# Patient Record
Sex: Female | Born: 1950 | Race: White | Hispanic: No | Marital: Married | Smoking: Former smoker
Health system: Southern US, Community
[De-identification: ages and names within clinical notes are randomized; demographics above are authoritative.]

## PROBLEM LIST (undated history)

## (undated) DIAGNOSIS — E7800 Pure hypercholesterolemia, unspecified: Secondary | ICD-10-CM

## (undated) DIAGNOSIS — T8859XA Other complications of anesthesia, initial encounter: Secondary | ICD-10-CM

## (undated) DIAGNOSIS — R112 Nausea with vomiting, unspecified: Secondary | ICD-10-CM

## (undated) DIAGNOSIS — I10 Essential (primary) hypertension: Secondary | ICD-10-CM

## (undated) DIAGNOSIS — R159 Full incontinence of feces: Secondary | ICD-10-CM

## (undated) DIAGNOSIS — M1990 Unspecified osteoarthritis, unspecified site: Secondary | ICD-10-CM

## (undated) DIAGNOSIS — G4733 Obstructive sleep apnea (adult) (pediatric): Secondary | ICD-10-CM

## (undated) DIAGNOSIS — M810 Age-related osteoporosis without current pathological fracture: Secondary | ICD-10-CM

## (undated) DIAGNOSIS — C801 Malignant (primary) neoplasm, unspecified: Secondary | ICD-10-CM

## (undated) DIAGNOSIS — N8110 Cystocele, unspecified: Secondary | ICD-10-CM

## (undated) HISTORY — DX: Cystocele, unspecified: N81.10

## (undated) HISTORY — DX: Essential (primary) hypertension: I10

## (undated) HISTORY — DX: Obstructive sleep apnea (adult) (pediatric): G47.33

## (undated) HISTORY — DX: Pure hypercholesterolemia, unspecified: E78.00

## (undated) HISTORY — DX: Full incontinence of feces: R15.9

## (undated) HISTORY — PX: TUBAL LIGATION: SHX77

## (undated) HISTORY — PX: INCONTINENCE SURGERY: SHX676

## (undated) HISTORY — PX: TONSILLECTOMY AND ADENOIDECTOMY: SUR1326

## (undated) HISTORY — DX: Age-related osteoporosis without current pathological fracture: M81.0

---

## 2000-03-27 ENCOUNTER — Encounter: Payer: Self-pay | Admitting: Obstetrics and Gynecology

## 2000-03-27 ENCOUNTER — Encounter: Admission: RE | Admit: 2000-03-27 | Discharge: 2000-03-27 | Payer: Self-pay | Admitting: Obstetrics and Gynecology

## 2001-04-06 ENCOUNTER — Encounter: Payer: Self-pay | Admitting: Obstetrics and Gynecology

## 2001-04-06 ENCOUNTER — Encounter: Admission: RE | Admit: 2001-04-06 | Discharge: 2001-04-06 | Payer: Self-pay | Admitting: Obstetrics and Gynecology

## 2002-04-07 ENCOUNTER — Encounter: Payer: Self-pay | Admitting: Obstetrics and Gynecology

## 2002-04-07 ENCOUNTER — Encounter: Admission: RE | Admit: 2002-04-07 | Discharge: 2002-04-07 | Payer: Self-pay | Admitting: Obstetrics and Gynecology

## 2002-04-08 ENCOUNTER — Encounter: Admission: RE | Admit: 2002-04-08 | Discharge: 2002-04-08 | Payer: Self-pay | Admitting: Obstetrics and Gynecology

## 2002-04-08 ENCOUNTER — Encounter: Payer: Self-pay | Admitting: Obstetrics and Gynecology

## 2003-04-11 ENCOUNTER — Encounter: Admission: RE | Admit: 2003-04-11 | Discharge: 2003-04-11 | Payer: Self-pay | Admitting: Obstetrics and Gynecology

## 2003-04-11 ENCOUNTER — Encounter: Payer: Self-pay | Admitting: Obstetrics and Gynecology

## 2004-04-12 ENCOUNTER — Encounter: Admission: RE | Admit: 2004-04-12 | Discharge: 2004-04-12 | Payer: Self-pay | Admitting: Obstetrics and Gynecology

## 2004-10-17 HISTORY — PX: COLONOSCOPY: SHX174

## 2005-04-24 ENCOUNTER — Encounter: Admission: RE | Admit: 2005-04-24 | Discharge: 2005-04-24 | Payer: Self-pay | Admitting: Obstetrics and Gynecology

## 2006-04-28 ENCOUNTER — Encounter: Admission: RE | Admit: 2006-04-28 | Discharge: 2006-04-28 | Payer: Self-pay | Admitting: Obstetrics and Gynecology

## 2007-04-30 ENCOUNTER — Encounter: Admission: RE | Admit: 2007-04-30 | Discharge: 2007-04-30 | Payer: Self-pay | Admitting: Obstetrics and Gynecology

## 2008-05-02 ENCOUNTER — Encounter: Admission: RE | Admit: 2008-05-02 | Discharge: 2008-05-02 | Payer: Self-pay | Admitting: Obstetrics and Gynecology

## 2008-11-16 ENCOUNTER — Encounter: Admission: RE | Admit: 2008-11-16 | Discharge: 2008-11-16 | Payer: Self-pay | Admitting: Family Medicine

## 2009-05-03 ENCOUNTER — Encounter: Admission: RE | Admit: 2009-05-03 | Discharge: 2009-05-03 | Payer: Self-pay | Admitting: Family Medicine

## 2010-05-14 ENCOUNTER — Encounter: Admission: RE | Admit: 2010-05-14 | Discharge: 2010-05-14 | Payer: Self-pay | Admitting: Obstetrics and Gynecology

## 2011-04-10 ENCOUNTER — Other Ambulatory Visit: Payer: Self-pay | Admitting: Family Medicine

## 2011-04-10 DIAGNOSIS — Z1231 Encounter for screening mammogram for malignant neoplasm of breast: Secondary | ICD-10-CM

## 2011-05-27 ENCOUNTER — Ambulatory Visit
Admission: RE | Admit: 2011-05-27 | Discharge: 2011-05-27 | Disposition: A | Discharge status: Home / Self Care | Payer: BC Managed Care – PPO | Source: Ambulatory Visit | Attending: Family Medicine | Admitting: Family Medicine

## 2011-05-27 DIAGNOSIS — Z1231 Encounter for screening mammogram for malignant neoplasm of breast: Secondary | ICD-10-CM

## 2012-04-22 ENCOUNTER — Other Ambulatory Visit: Payer: Self-pay | Admitting: Family Medicine

## 2012-04-22 DIAGNOSIS — Z1231 Encounter for screening mammogram for malignant neoplasm of breast: Secondary | ICD-10-CM

## 2012-06-08 ENCOUNTER — Ambulatory Visit: Payer: BC Managed Care – PPO

## 2012-06-23 ENCOUNTER — Ambulatory Visit
Admission: RE | Admit: 2012-06-23 | Discharge: 2012-06-23 | Disposition: A | Discharge status: Home / Self Care | Payer: BC Managed Care – PPO | Source: Ambulatory Visit | Attending: Family Medicine | Admitting: Family Medicine

## 2012-06-23 DIAGNOSIS — Z1231 Encounter for screening mammogram for malignant neoplasm of breast: Secondary | ICD-10-CM

## 2012-12-10 ENCOUNTER — Other Ambulatory Visit: Payer: Self-pay | Admitting: Family Medicine

## 2012-12-10 ENCOUNTER — Other Ambulatory Visit (HOSPITAL_COMMUNITY)
Admission: RE | Admit: 2012-12-10 | Discharge: 2012-12-10 | Disposition: A | Discharge status: Home / Self Care | Payer: BC Managed Care – PPO | Source: Ambulatory Visit | Attending: Family Medicine | Admitting: Family Medicine

## 2012-12-10 DIAGNOSIS — Z124 Encounter for screening for malignant neoplasm of cervix: Secondary | ICD-10-CM | POA: Insufficient documentation

## 2013-03-16 DEATH — deceased

## 2013-06-01 ENCOUNTER — Other Ambulatory Visit: Payer: Self-pay

## 2013-06-01 DIAGNOSIS — Z1231 Encounter for screening mammogram for malignant neoplasm of breast: Secondary | ICD-10-CM

## 2013-06-24 ENCOUNTER — Ambulatory Visit
Admission: RE | Admit: 2013-06-24 | Discharge: 2013-06-24 | Disposition: A | Discharge status: Home / Self Care | Payer: BC Managed Care – PPO | Source: Ambulatory Visit

## 2013-06-24 DIAGNOSIS — Z1231 Encounter for screening mammogram for malignant neoplasm of breast: Secondary | ICD-10-CM

## 2013-08-27 ENCOUNTER — Encounter: Payer: Self-pay | Admitting: General Surgery

## 2013-08-27 DIAGNOSIS — E7800 Pure hypercholesterolemia, unspecified: Secondary | ICD-10-CM | POA: Insufficient documentation

## 2013-08-27 DIAGNOSIS — I10 Essential (primary) hypertension: Secondary | ICD-10-CM | POA: Insufficient documentation

## 2013-08-27 DIAGNOSIS — G4733 Obstructive sleep apnea (adult) (pediatric): Secondary | ICD-10-CM

## 2013-08-30 ENCOUNTER — Encounter: Payer: Self-pay | Admitting: Cardiology

## 2013-08-30 ENCOUNTER — Ambulatory Visit (INDEPENDENT_AMBULATORY_CARE_PROVIDER_SITE_OTHER): Payer: BC Managed Care – PPO | Admitting: Cardiology

## 2013-08-30 VITALS — BP 153/91 | HR 73 | Ht 65.5 in | Wt 185.4 lb

## 2013-08-30 DIAGNOSIS — I10 Essential (primary) hypertension: Secondary | ICD-10-CM

## 2013-08-30 DIAGNOSIS — G4733 Obstructive sleep apnea (adult) (pediatric): Secondary | ICD-10-CM

## 2013-08-30 NOTE — Progress Notes (Signed)
[address] [place], [place]  [address] Phone: [phone] Fax:  [phone]  Date:  08/30/2013   ID:  ALLANNAH KEMPEN, DOB April 11, 1951, MRN [id number]  PCP:  Lupita Raider, MD  Sleep Medicine:  Armanda Magic, MD   History of Present Illness: Beth Paul is a 62 y.o. female with a history of OSA and HTN who presents today for followup.  She is doing well.  She tolerates her CPAP without difficulty.  She tolerates her nasal pillow mask with chin strap and feels with pressure is adequate.  She has no daytime sleepiness and feels rested when she gets up in the am.      Wt Readings from Last 3 Encounters:  08/30/13 185 lb 6.4 oz (84.097 kg)  08/27/13 180 lb 9.6 oz (81.92 kg)    Past Medical History  Diagnosis Date  . Hypertension   . Hypercholesteremia   . Osteoporosis   . OSA (obstructive sleep apnea)     w CPAP at 6cm H2O  . Female bladder prolapse     Dr. Christella Hartigan  . Fecal incontinence     Dr Mia Creek    Current Outpatient Prescriptions  Medication Sig Dispense Refill  . BABY ASPIRIN PO Take 81 mg by mouth daily.      . Calcium Carb-Cholecalciferol (CALCIUM + D3) 600-200 MG-UNIT TABS Take 1 tablet by mouth 2 (two) times daily.      . Multiple Vitamin (MULTIVITAMIN) tablet Take 1 tablet by mouth daily.      Marland Kitchen NIFEdipine (PROCARDIA-XL/ADALAT CC) 30 MG 24 hr tablet Take 30 mg by mouth daily.      . Omega-3 Fatty Acids (FISH OIL) 1000 MG CAPS Take 1 capsule by mouth 3 (three) times daily.      . simvastatin (ZOCOR) 20 MG tablet Take 20 mg by mouth daily.       No current facility-administered medications for this visit.    Allergies:    Allergies  Allergen Reactions  . Lipitor [Atorvastatin] Other (See Comments)    Muscle aches    Social History:  The patient  reports that she quit smoking about 23 years ago. Her smoking use included Cigarettes. She smoked 0.00 packs per day. She does not have any smokeless tobacco history on file. She reports that she  does not drink alcohol or use illicit drugs.   Family History:  The patient's family history is not on file.   ROS:  Please see the history of present illness.      All other systems reviewed and negative.   PHYSICAL EXAM: VS:  BP 153/91  Pulse 73  Ht 5' 5.5" (1.664 m)  Wt 185 lb 6.4 oz (84.097 kg)  BMI 30.37 kg/m2 Well nourished, well developed, in no acute distress HEENT: normal Neck: no JVD Cardiac:  normal S1, S2; RRR; no murmur Lungs:  clear to auscultation bilaterally, no wheezing, rhonchi or rales Abd: soft, nontender, no hepatomegaly Ext: no edema Skin: warm and dry Neuro:  CNs 2-12 intact, no focal abnormalities noted       ASSESSMENT AND PLAN:  1. OSA on CPAP and tolerating well  - Her SD card is not working today so I have asked her to go to [facility] and get an new card and in a few days bring it back to our office to download 2. HTN - mildly elevated  - continue Procardia  Followup with me in 6 months  Signed, Armanda Magic, MD 08/30/2013 9:19  AM  

## 2013-08-30 NOTE — Patient Instructions (Signed)
Your physician recommends that you continue on your current medications as directed. Please refer to the Current Medication list given to you today.  Your physician wants you to follow-up in: 6 Months with Dr Turner You will receive a reminder letter in the mail two months in advance. If you don't receive a letter, please call our office to schedule the follow-up appointment.  

## 2013-09-23 ENCOUNTER — Other Ambulatory Visit: Payer: Self-pay | Admitting: General Surgery

## 2013-09-23 DIAGNOSIS — G4733 Obstructive sleep apnea (adult) (pediatric): Secondary | ICD-10-CM

## 2013-12-24 ENCOUNTER — Telehealth: Payer: Self-pay | Admitting: Cardiology

## 2013-12-24 NOTE — Telephone Encounter (Signed)
Faxed for pt.

## 2013-12-24 NOTE — Telephone Encounter (Signed)
Ok that is fine. Ok to fax paper that I signed to dentist

## 2013-12-24 NOTE — Telephone Encounter (Signed)
New Prob     Following up on paperwork regarding sleep apnea that was faxed over on first week of April. Please call.

## 2013-12-24 NOTE — Telephone Encounter (Signed)
Pt stated in the past she wanted a oral device due to camping. 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 She just wants another option while out camping. She will still be using CPAP at home. TO Dr Mayford Knifeurner to advise

## 2013-12-24 NOTE — Telephone Encounter (Signed)
[facility]. Asked for a call back.

## 2014-04-14 ENCOUNTER — Other Ambulatory Visit: Payer: Self-pay | Admitting: Family Medicine

## 2014-04-14 DIAGNOSIS — N939 Abnormal uterine and vaginal bleeding, unspecified: Secondary | ICD-10-CM

## 2014-04-15 ENCOUNTER — Encounter (INDEPENDENT_AMBULATORY_CARE_PROVIDER_SITE_OTHER): Payer: Self-pay

## 2014-04-15 ENCOUNTER — Ambulatory Visit
Admission: RE | Admit: 2014-04-15 | Discharge: 2014-04-15 | Disposition: A | Discharge status: Home / Self Care | Payer: BC Managed Care – PPO | Source: Ambulatory Visit | Attending: Family Medicine | Admitting: Family Medicine

## 2014-04-15 DIAGNOSIS — N939 Abnormal uterine and vaginal bleeding, unspecified: Secondary | ICD-10-CM

## 2014-06-01 ENCOUNTER — Other Ambulatory Visit: Payer: Self-pay

## 2014-06-01 DIAGNOSIS — Z1231 Encounter for screening mammogram for malignant neoplasm of breast: Secondary | ICD-10-CM

## 2014-06-28 ENCOUNTER — Ambulatory Visit
Admission: RE | Admit: 2014-06-28 | Discharge: 2014-06-28 | Disposition: A | Discharge status: Home / Self Care | Payer: BC Managed Care – PPO | Source: Ambulatory Visit

## 2014-06-28 DIAGNOSIS — Z1231 Encounter for screening mammogram for malignant neoplasm of breast: Secondary | ICD-10-CM

## 2014-12-14 ENCOUNTER — Encounter: Payer: Self-pay | Admitting: Cardiology

## 2015-05-26 ENCOUNTER — Other Ambulatory Visit: Payer: Self-pay

## 2015-05-26 DIAGNOSIS — Z1231 Encounter for screening mammogram for malignant neoplasm of breast: Secondary | ICD-10-CM

## 2015-07-04 ENCOUNTER — Ambulatory Visit
Admission: RE | Admit: 2015-07-04 | Discharge: 2015-07-04 | Disposition: A | Discharge status: Home / Self Care | Payer: BC Managed Care – PPO | Source: Ambulatory Visit

## 2015-07-04 DIAGNOSIS — Z1231 Encounter for screening mammogram for malignant neoplasm of breast: Secondary | ICD-10-CM

## 2016-02-08 ENCOUNTER — Other Ambulatory Visit: Payer: Self-pay | Admitting: Family Medicine

## 2016-02-08 DIAGNOSIS — N6320 Unspecified lump in the left breast, unspecified quadrant: Secondary | ICD-10-CM

## 2016-02-14 ENCOUNTER — Ambulatory Visit
Admission: RE | Admit: 2016-02-14 | Discharge: 2016-02-14 | Disposition: A | Discharge status: Home / Self Care | Payer: BC Managed Care – PPO | Source: Ambulatory Visit | Attending: Family Medicine | Admitting: Family Medicine

## 2016-02-14 DIAGNOSIS — N6320 Unspecified lump in the left breast, unspecified quadrant: Secondary | ICD-10-CM

## 2016-06-18 ENCOUNTER — Other Ambulatory Visit: Payer: Self-pay | Admitting: Family Medicine

## 2016-06-18 DIAGNOSIS — Z1231 Encounter for screening mammogram for malignant neoplasm of breast: Secondary | ICD-10-CM

## 2016-07-17 ENCOUNTER — Ambulatory Visit
Admission: RE | Admit: 2016-07-17 | Discharge: 2016-07-17 | Disposition: A | Discharge status: Home / Self Care | Payer: Medicare Other | Source: Ambulatory Visit | Attending: Family Medicine | Admitting: Family Medicine

## 2016-07-17 DIAGNOSIS — Z1231 Encounter for screening mammogram for malignant neoplasm of breast: Secondary | ICD-10-CM

## 2017-06-10 ENCOUNTER — Other Ambulatory Visit: Payer: Self-pay | Admitting: Family Medicine

## 2017-06-10 DIAGNOSIS — Z1239 Encounter for other screening for malignant neoplasm of breast: Secondary | ICD-10-CM

## 2017-07-18 ENCOUNTER — Ambulatory Visit
Admission: RE | Admit: 2017-07-18 | Discharge: 2017-07-18 | Disposition: A | Discharge status: Home / Self Care | Payer: Medicare Other | Source: Ambulatory Visit | Attending: Family Medicine | Admitting: Family Medicine

## 2017-07-18 DIAGNOSIS — Z1239 Encounter for other screening for malignant neoplasm of breast: Secondary | ICD-10-CM

## 2018-06-17 ENCOUNTER — Other Ambulatory Visit: Payer: Self-pay | Admitting: Family Medicine

## 2018-06-17 DIAGNOSIS — Z1231 Encounter for screening mammogram for malignant neoplasm of breast: Secondary | ICD-10-CM

## 2018-07-22 ENCOUNTER — Ambulatory Visit
Admission: RE | Admit: 2018-07-22 | Discharge: 2018-07-22 | Disposition: A | Discharge status: Home / Self Care | Payer: Medicare Other | Source: Ambulatory Visit | Attending: Family Medicine | Admitting: Family Medicine

## 2018-07-22 DIAGNOSIS — Z1231 Encounter for screening mammogram for malignant neoplasm of breast: Secondary | ICD-10-CM

## 2018-07-23 ENCOUNTER — Other Ambulatory Visit: Payer: Self-pay | Admitting: Family Medicine

## 2018-07-23 DIAGNOSIS — R928 Other abnormal and inconclusive findings on diagnostic imaging of breast: Secondary | ICD-10-CM

## 2018-07-29 ENCOUNTER — Ambulatory Visit: Payer: Medicare Other

## 2018-07-29 ENCOUNTER — Ambulatory Visit
Admission: RE | Admit: 2018-07-29 | Discharge: 2018-07-29 | Disposition: A | Discharge status: Home / Self Care | Payer: Medicare Other | Source: Ambulatory Visit | Attending: Family Medicine | Admitting: Family Medicine

## 2018-07-29 DIAGNOSIS — R928 Other abnormal and inconclusive findings on diagnostic imaging of breast: Secondary | ICD-10-CM

## 2019-06-23 ENCOUNTER — Other Ambulatory Visit: Payer: Self-pay | Admitting: Family Medicine

## 2019-06-23 DIAGNOSIS — Z1231 Encounter for screening mammogram for malignant neoplasm of breast: Secondary | ICD-10-CM

## 2019-08-04 ENCOUNTER — Ambulatory Visit
Admission: RE | Admit: 2019-08-04 | Discharge: 2019-08-04 | Disposition: A | Discharge status: Home / Self Care | Payer: Medicare Other | Source: Ambulatory Visit | Attending: Family Medicine | Admitting: Family Medicine

## 2019-08-04 ENCOUNTER — Other Ambulatory Visit: Payer: Self-pay

## 2019-08-04 DIAGNOSIS — Z1231 Encounter for screening mammogram for malignant neoplasm of breast: Secondary | ICD-10-CM

## 2019-10-25 ENCOUNTER — Ambulatory Visit: Payer: Medicare Other

## 2020-05-11 DIAGNOSIS — M810 Age-related osteoporosis without current pathological fracture: Secondary | ICD-10-CM | POA: Diagnosis not present

## 2020-05-11 DIAGNOSIS — E782 Mixed hyperlipidemia: Secondary | ICD-10-CM | POA: Diagnosis not present

## 2020-05-11 DIAGNOSIS — G4733 Obstructive sleep apnea (adult) (pediatric): Secondary | ICD-10-CM | POA: Diagnosis not present

## 2020-05-11 DIAGNOSIS — G4700 Insomnia, unspecified: Secondary | ICD-10-CM | POA: Diagnosis not present

## 2020-05-11 DIAGNOSIS — Z0000 Encounter for general adult medical examination without abnormal findings: Secondary | ICD-10-CM | POA: Diagnosis not present

## 2020-05-11 DIAGNOSIS — I10 Essential (primary) hypertension: Secondary | ICD-10-CM | POA: Diagnosis not present

## 2020-05-11 DIAGNOSIS — N951 Menopausal and female climacteric states: Secondary | ICD-10-CM | POA: Diagnosis not present

## 2020-06-26 ENCOUNTER — Other Ambulatory Visit: Payer: Self-pay | Admitting: Family Medicine

## 2020-06-26 DIAGNOSIS — Z1231 Encounter for screening mammogram for malignant neoplasm of breast: Secondary | ICD-10-CM

## 2020-08-07 ENCOUNTER — Ambulatory Visit
Admission: RE | Admit: 2020-08-07 | Discharge: 2020-08-07 | Disposition: A | Discharge status: Home / Self Care | Payer: Medicare PPO | Source: Ambulatory Visit | Attending: Family Medicine | Admitting: Family Medicine

## 2020-08-07 ENCOUNTER — Other Ambulatory Visit: Payer: Self-pay

## 2020-08-07 DIAGNOSIS — Z1231 Encounter for screening mammogram for malignant neoplasm of breast: Secondary | ICD-10-CM

## 2020-08-28 DIAGNOSIS — G4733 Obstructive sleep apnea (adult) (pediatric): Secondary | ICD-10-CM | POA: Diagnosis not present

## 2020-08-30 ENCOUNTER — Other Ambulatory Visit: Payer: Self-pay

## 2020-08-30 ENCOUNTER — Ambulatory Visit
Admission: RE | Admit: 2020-08-30 | Discharge: 2020-08-30 | Disposition: A | Discharge status: Home / Self Care | Payer: Medicare PPO | Source: Ambulatory Visit | Attending: Family Medicine | Admitting: Family Medicine

## 2020-08-30 DIAGNOSIS — Z1231 Encounter for screening mammogram for malignant neoplasm of breast: Secondary | ICD-10-CM | POA: Diagnosis not present

## 2020-09-04 DIAGNOSIS — G4733 Obstructive sleep apnea (adult) (pediatric): Secondary | ICD-10-CM | POA: Diagnosis not present

## 2020-10-11 DIAGNOSIS — Z961 Presence of intraocular lens: Secondary | ICD-10-CM | POA: Diagnosis not present

## 2020-10-11 DIAGNOSIS — H5213 Myopia, bilateral: Secondary | ICD-10-CM | POA: Diagnosis not present

## 2020-11-17 DIAGNOSIS — I10 Essential (primary) hypertension: Secondary | ICD-10-CM | POA: Diagnosis not present

## 2020-11-17 DIAGNOSIS — E669 Obesity, unspecified: Secondary | ICD-10-CM | POA: Diagnosis not present

## 2020-11-17 DIAGNOSIS — E782 Mixed hyperlipidemia: Secondary | ICD-10-CM | POA: Diagnosis not present

## 2020-12-06 DIAGNOSIS — D2271 Melanocytic nevi of right lower limb, including hip: Secondary | ICD-10-CM | POA: Diagnosis not present

## 2020-12-06 DIAGNOSIS — L718 Other rosacea: Secondary | ICD-10-CM | POA: Diagnosis not present

## 2020-12-06 DIAGNOSIS — D2261 Melanocytic nevi of right upper limb, including shoulder: Secondary | ICD-10-CM | POA: Diagnosis not present

## 2020-12-06 DIAGNOSIS — D225 Melanocytic nevi of trunk: Secondary | ICD-10-CM | POA: Diagnosis not present

## 2020-12-06 DIAGNOSIS — Z85828 Personal history of other malignant neoplasm of skin: Secondary | ICD-10-CM | POA: Diagnosis not present

## 2020-12-06 DIAGNOSIS — L738 Other specified follicular disorders: Secondary | ICD-10-CM | POA: Diagnosis not present

## 2020-12-06 DIAGNOSIS — D2272 Melanocytic nevi of left lower limb, including hip: Secondary | ICD-10-CM | POA: Diagnosis not present

## 2020-12-06 DIAGNOSIS — D2262 Melanocytic nevi of left upper limb, including shoulder: Secondary | ICD-10-CM | POA: Diagnosis not present

## 2021-05-03 DIAGNOSIS — L738 Other specified follicular disorders: Secondary | ICD-10-CM | POA: Diagnosis not present

## 2021-05-03 DIAGNOSIS — L570 Actinic keratosis: Secondary | ICD-10-CM | POA: Diagnosis not present

## 2021-05-03 DIAGNOSIS — L649 Androgenic alopecia, unspecified: Secondary | ICD-10-CM | POA: Diagnosis not present

## 2021-05-03 DIAGNOSIS — L720 Epidermal cyst: Secondary | ICD-10-CM | POA: Diagnosis not present

## 2021-05-03 DIAGNOSIS — L723 Sebaceous cyst: Secondary | ICD-10-CM | POA: Diagnosis not present

## 2021-05-03 DIAGNOSIS — Z85828 Personal history of other malignant neoplasm of skin: Secondary | ICD-10-CM | POA: Diagnosis not present

## 2021-05-16 DIAGNOSIS — G4733 Obstructive sleep apnea (adult) (pediatric): Secondary | ICD-10-CM | POA: Diagnosis not present

## 2021-05-16 DIAGNOSIS — I10 Essential (primary) hypertension: Secondary | ICD-10-CM | POA: Diagnosis not present

## 2021-05-16 DIAGNOSIS — M810 Age-related osteoporosis without current pathological fracture: Secondary | ICD-10-CM | POA: Diagnosis not present

## 2021-05-16 DIAGNOSIS — E782 Mixed hyperlipidemia: Secondary | ICD-10-CM | POA: Diagnosis not present

## 2021-05-16 DIAGNOSIS — Z0000 Encounter for general adult medical examination without abnormal findings: Secondary | ICD-10-CM | POA: Diagnosis not present

## 2021-05-16 DIAGNOSIS — R7303 Prediabetes: Secondary | ICD-10-CM | POA: Diagnosis not present

## 2021-05-16 DIAGNOSIS — E669 Obesity, unspecified: Secondary | ICD-10-CM | POA: Diagnosis not present

## 2021-05-16 DIAGNOSIS — N951 Menopausal and female climacteric states: Secondary | ICD-10-CM | POA: Diagnosis not present

## 2021-05-16 DIAGNOSIS — G4700 Insomnia, unspecified: Secondary | ICD-10-CM | POA: Diagnosis not present

## 2021-05-16 IMAGING — MG DIGITAL SCREENING BILAT W/ TOMO W/ CAD
8 series · 8 of 24 positions shown · non-contrast
Comparison: Previous exam(s).

CLINICAL DATA: Screening.

EXAM:
DIGITAL SCREENING BILATERAL MAMMOGRAM WITH TOMO AND CAD

[R CC synth-2D]
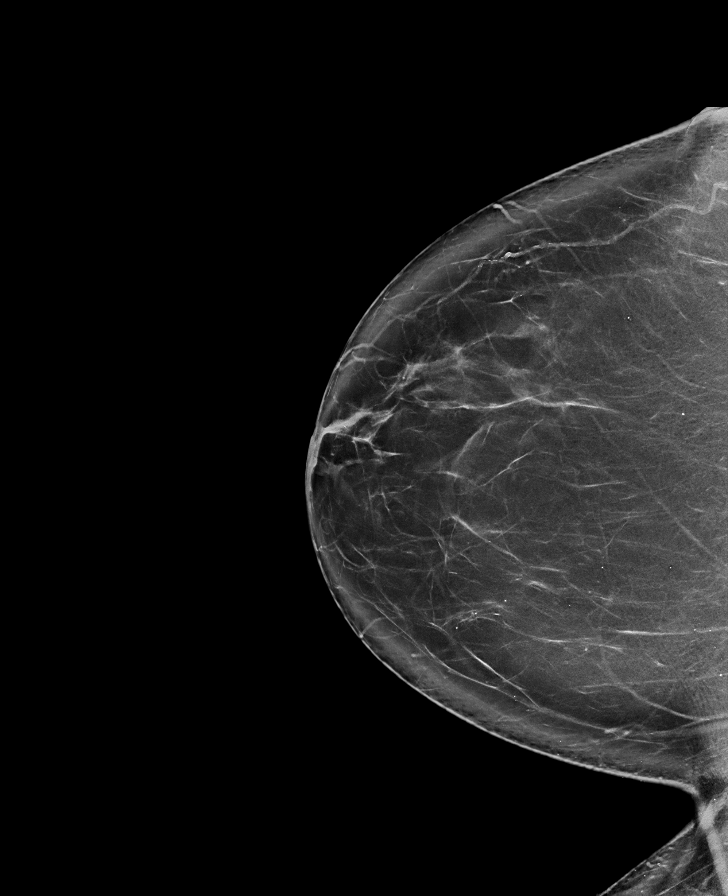

[L MLO synth-2D]
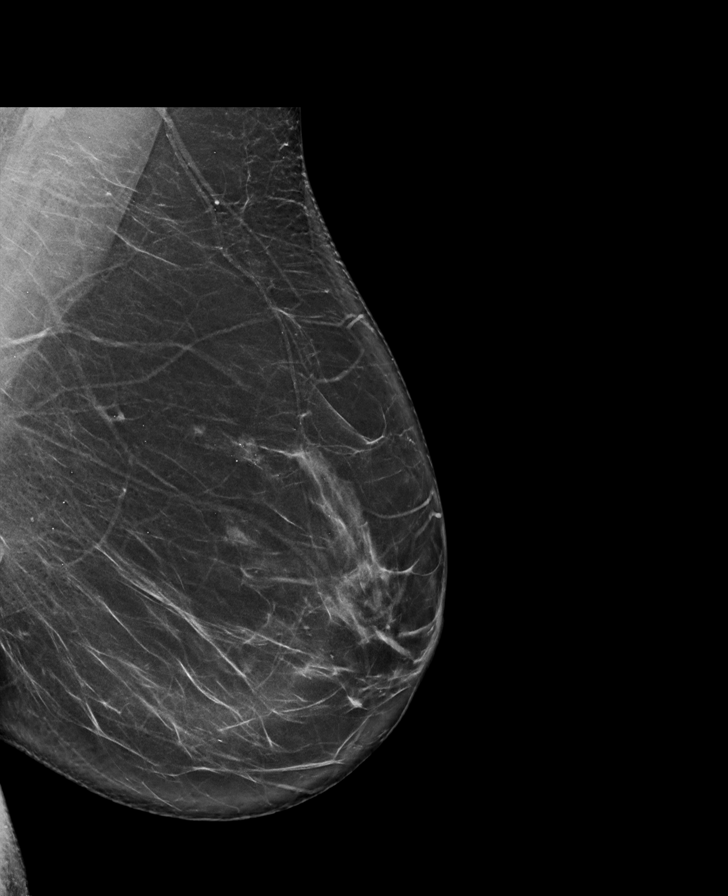

[R MLO synth-2D]
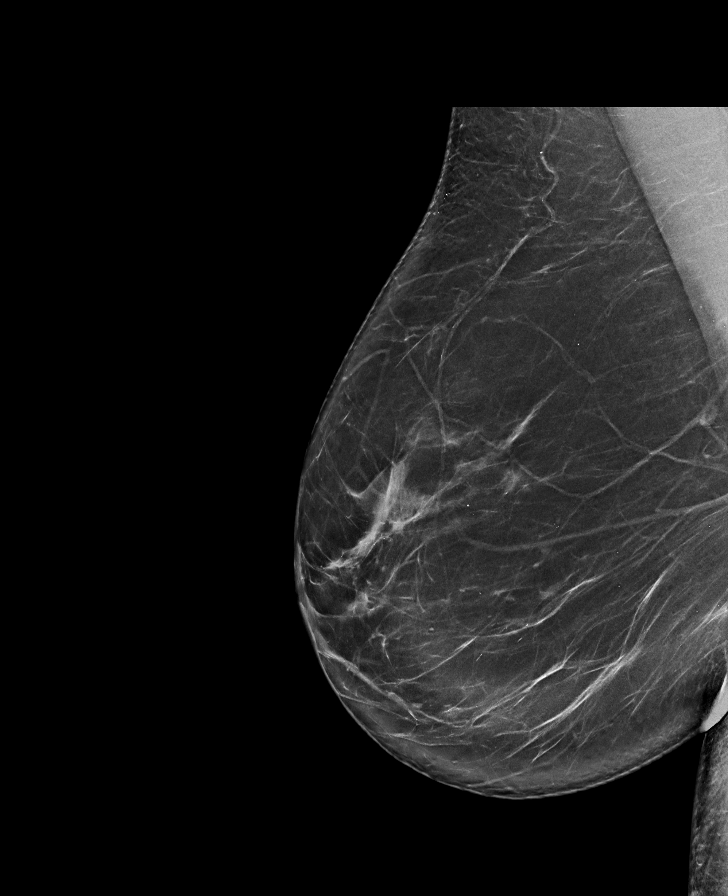

[L CC synth-2D]
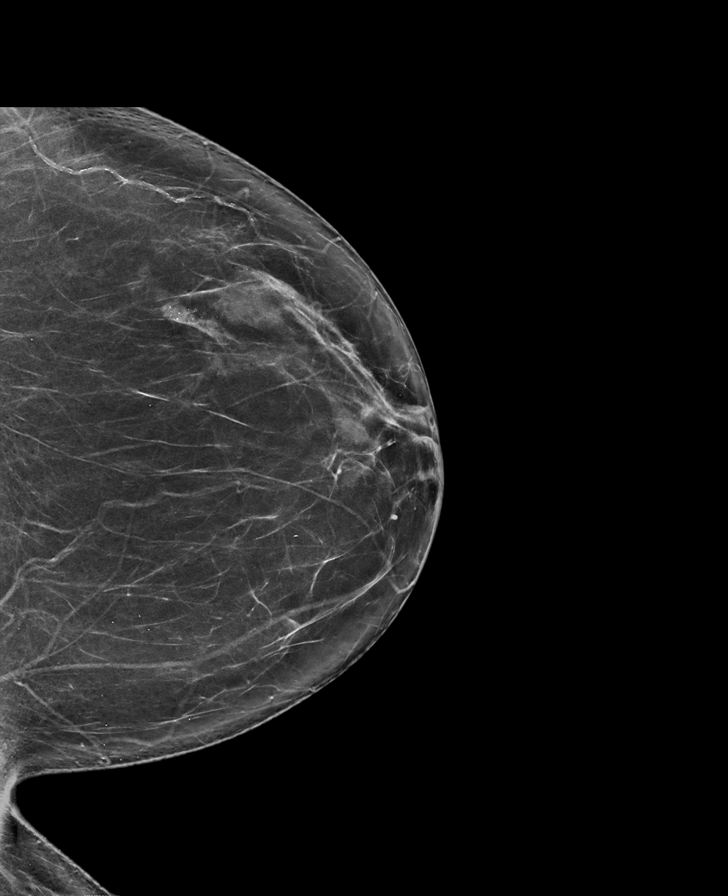

[R CC tomo · tomo slice 46/91.0]
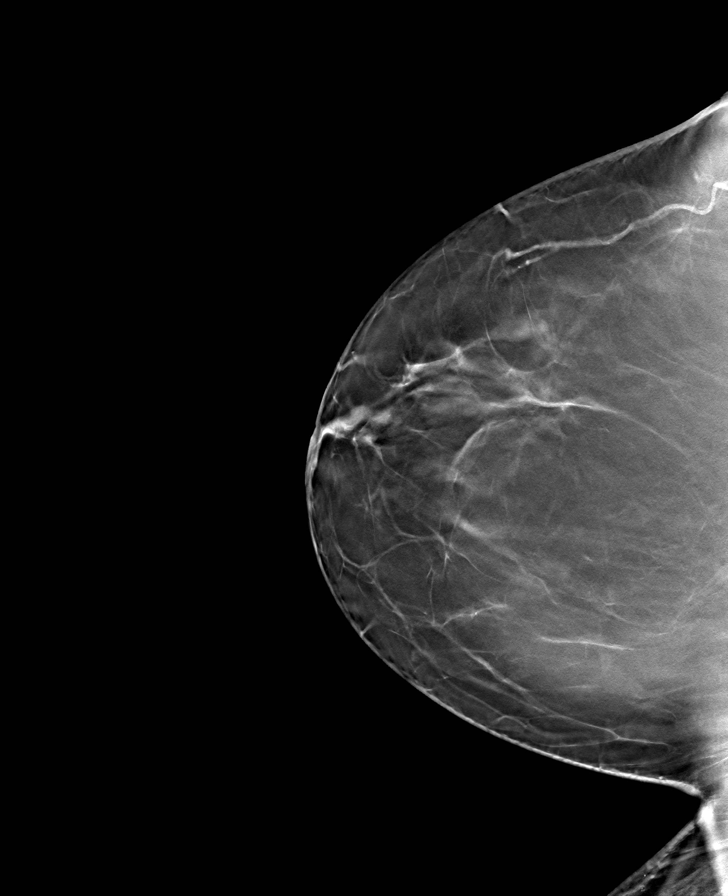

[R MLO tomo · tomo slice 44/87.0]
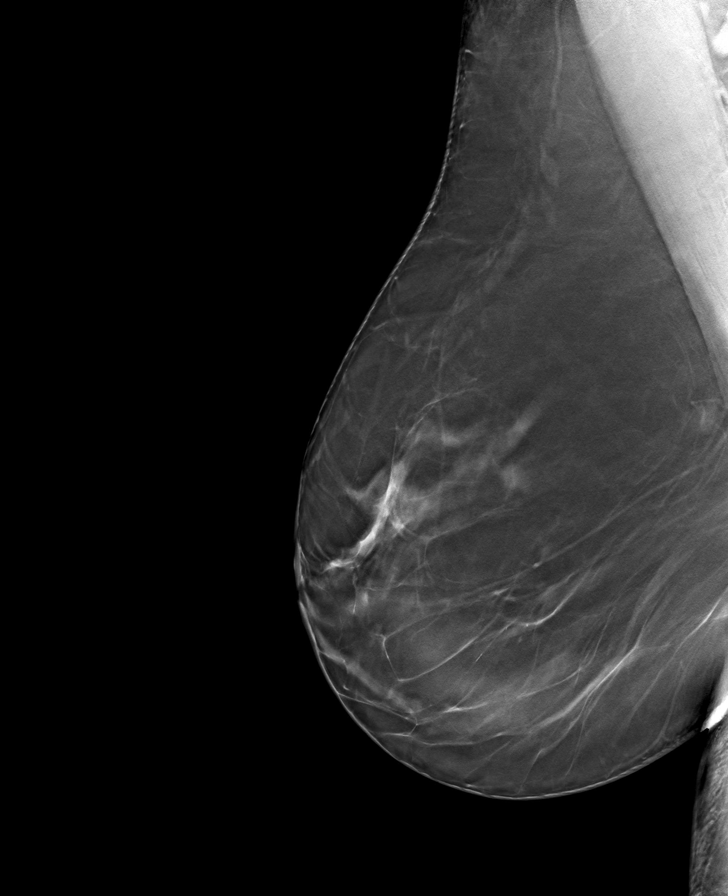

[L MLO tomo · tomo slice 44/87.0]
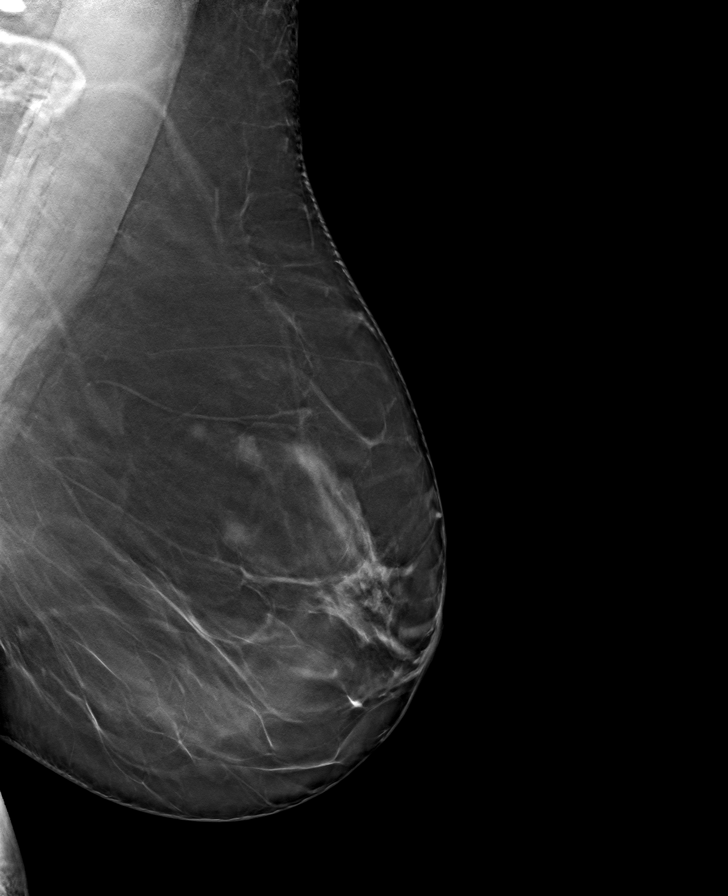

[L CC tomo · tomo slice 40/79.0]
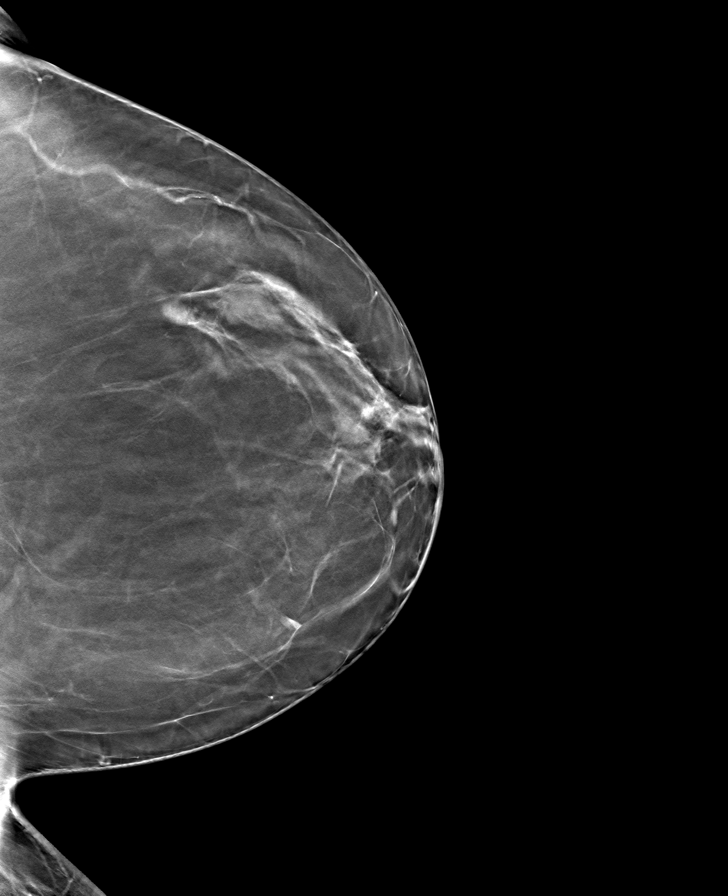

[8 of 24 positions shown; findings below may reference images not displayed]

ACR Breast Density Category b: There are scattered areas of
fibroglandular density.
FINDINGS: There are no findings suspicious for malignancy. Images were
processed with CAD.
IMPRESSION: No mammographic evidence of malignancy. A result letter of this
screening mammogram will be mailed directly to the patient.

RECOMMENDATION:
Screening mammogram in one year. (Code:CN-U-775)

BI-RADS CATEGORY  1: Negative.

## 2021-05-17 ENCOUNTER — Other Ambulatory Visit: Payer: Self-pay | Admitting: Advanced Practice Midwife

## 2021-05-17 DIAGNOSIS — E2839 Other primary ovarian failure: Secondary | ICD-10-CM

## 2021-05-17 DIAGNOSIS — M810 Age-related osteoporosis without current pathological fracture: Secondary | ICD-10-CM

## 2021-07-05 DIAGNOSIS — R32 Unspecified urinary incontinence: Secondary | ICD-10-CM | POA: Diagnosis not present

## 2021-07-05 DIAGNOSIS — R159 Full incontinence of feces: Secondary | ICD-10-CM | POA: Diagnosis not present

## 2021-07-20 DIAGNOSIS — R151 Fecal smearing: Secondary | ICD-10-CM | POA: Diagnosis not present

## 2021-07-20 DIAGNOSIS — I10 Essential (primary) hypertension: Secondary | ICD-10-CM | POA: Diagnosis not present

## 2021-07-20 DIAGNOSIS — Z4542 Encounter for adjustment and management of neuropacemaker (brain) (peripheral nerve) (spinal cord): Secondary | ICD-10-CM | POA: Diagnosis not present

## 2021-07-20 DIAGNOSIS — R159 Full incontinence of feces: Secondary | ICD-10-CM | POA: Diagnosis not present

## 2021-07-23 ENCOUNTER — Other Ambulatory Visit: Payer: Self-pay | Admitting: Family Medicine

## 2021-07-23 DIAGNOSIS — Z1231 Encounter for screening mammogram for malignant neoplasm of breast: Secondary | ICD-10-CM

## 2021-07-25 DIAGNOSIS — D485 Neoplasm of uncertain behavior of skin: Secondary | ICD-10-CM | POA: Diagnosis not present

## 2021-07-25 DIAGNOSIS — Z85828 Personal history of other malignant neoplasm of skin: Secondary | ICD-10-CM | POA: Diagnosis not present

## 2021-07-25 DIAGNOSIS — L708 Other acne: Secondary | ICD-10-CM | POA: Diagnosis not present

## 2021-08-01 DIAGNOSIS — R32 Unspecified urinary incontinence: Secondary | ICD-10-CM | POA: Diagnosis not present

## 2021-08-31 ENCOUNTER — Ambulatory Visit: Payer: Medicare PPO

## 2021-10-05 ENCOUNTER — Ambulatory Visit
Admission: RE | Admit: 2021-10-05 | Discharge: 2021-10-05 | Disposition: A | Discharge status: Home / Self Care | Payer: Medicare PPO | Source: Ambulatory Visit | Attending: Family Medicine | Admitting: Family Medicine

## 2021-10-05 DIAGNOSIS — Z1231 Encounter for screening mammogram for malignant neoplasm of breast: Secondary | ICD-10-CM

## 2021-10-17 DIAGNOSIS — H26493 Other secondary cataract, bilateral: Secondary | ICD-10-CM | POA: Diagnosis not present

## 2021-10-17 DIAGNOSIS — H5213 Myopia, bilateral: Secondary | ICD-10-CM | POA: Diagnosis not present

## 2021-10-17 DIAGNOSIS — H353131 Nonexudative age-related macular degeneration, bilateral, early dry stage: Secondary | ICD-10-CM | POA: Diagnosis not present

## 2021-11-05 ENCOUNTER — Ambulatory Visit
Admission: RE | Admit: 2021-11-05 | Discharge: 2021-11-05 | Disposition: A | Discharge status: Home / Self Care | Payer: Medicare PPO | Source: Ambulatory Visit | Attending: Advanced Practice Midwife | Admitting: Advanced Practice Midwife

## 2021-11-05 DIAGNOSIS — E2839 Other primary ovarian failure: Secondary | ICD-10-CM

## 2021-11-05 DIAGNOSIS — Z780 Asymptomatic menopausal state: Secondary | ICD-10-CM | POA: Diagnosis not present

## 2021-11-05 DIAGNOSIS — M8589 Other specified disorders of bone density and structure, multiple sites: Secondary | ICD-10-CM | POA: Diagnosis not present

## 2021-11-05 DIAGNOSIS — M810 Age-related osteoporosis without current pathological fracture: Secondary | ICD-10-CM

## 2022-01-09 DIAGNOSIS — D2262 Melanocytic nevi of left upper limb, including shoulder: Secondary | ICD-10-CM | POA: Diagnosis not present

## 2022-01-09 DIAGNOSIS — D2261 Melanocytic nevi of right upper limb, including shoulder: Secondary | ICD-10-CM | POA: Diagnosis not present

## 2022-01-09 DIAGNOSIS — L700 Acne vulgaris: Secondary | ICD-10-CM | POA: Diagnosis not present

## 2022-01-09 DIAGNOSIS — Z85828 Personal history of other malignant neoplasm of skin: Secondary | ICD-10-CM | POA: Diagnosis not present

## 2022-01-09 DIAGNOSIS — L720 Epidermal cyst: Secondary | ICD-10-CM | POA: Diagnosis not present

## 2022-01-09 DIAGNOSIS — L84 Corns and callosities: Secondary | ICD-10-CM | POA: Diagnosis not present

## 2022-01-09 DIAGNOSIS — L821 Other seborrheic keratosis: Secondary | ICD-10-CM | POA: Diagnosis not present

## 2022-01-09 DIAGNOSIS — L918 Other hypertrophic disorders of the skin: Secondary | ICD-10-CM | POA: Diagnosis not present

## 2022-01-09 DIAGNOSIS — D225 Melanocytic nevi of trunk: Secondary | ICD-10-CM | POA: Diagnosis not present

## 2022-02-07 DIAGNOSIS — Z09 Encounter for follow-up examination after completed treatment for conditions other than malignant neoplasm: Secondary | ICD-10-CM | POA: Diagnosis not present

## 2022-05-17 DIAGNOSIS — G4733 Obstructive sleep apnea (adult) (pediatric): Secondary | ICD-10-CM | POA: Diagnosis not present

## 2022-05-17 DIAGNOSIS — R7303 Prediabetes: Secondary | ICD-10-CM | POA: Diagnosis not present

## 2022-05-17 DIAGNOSIS — N951 Menopausal and female climacteric states: Secondary | ICD-10-CM | POA: Diagnosis not present

## 2022-05-17 DIAGNOSIS — I10 Essential (primary) hypertension: Secondary | ICD-10-CM | POA: Diagnosis not present

## 2022-05-17 DIAGNOSIS — M810 Age-related osteoporosis without current pathological fracture: Secondary | ICD-10-CM | POA: Diagnosis not present

## 2022-05-17 DIAGNOSIS — Z0000 Encounter for general adult medical examination without abnormal findings: Secondary | ICD-10-CM | POA: Diagnosis not present

## 2022-05-17 DIAGNOSIS — E669 Obesity, unspecified: Secondary | ICD-10-CM | POA: Diagnosis not present

## 2022-05-17 DIAGNOSIS — E782 Mixed hyperlipidemia: Secondary | ICD-10-CM | POA: Diagnosis not present

## 2022-05-17 DIAGNOSIS — G4700 Insomnia, unspecified: Secondary | ICD-10-CM | POA: Diagnosis not present

## 2022-08-28 ENCOUNTER — Other Ambulatory Visit: Payer: Self-pay | Admitting: Family Medicine

## 2022-08-28 DIAGNOSIS — Z1231 Encounter for screening mammogram for malignant neoplasm of breast: Secondary | ICD-10-CM

## 2022-10-18 ENCOUNTER — Ambulatory Visit (INDEPENDENT_AMBULATORY_CARE_PROVIDER_SITE_OTHER): Payer: Medicare Other | Admitting: Orthopaedic Surgery

## 2022-10-18 ENCOUNTER — Ambulatory Visit (INDEPENDENT_AMBULATORY_CARE_PROVIDER_SITE_OTHER): Payer: Medicare Other

## 2022-10-18 ENCOUNTER — Encounter: Payer: Self-pay | Admitting: Orthopaedic Surgery

## 2022-10-18 DIAGNOSIS — M25561 Pain in right knee: Secondary | ICD-10-CM

## 2022-10-18 DIAGNOSIS — G8929 Other chronic pain: Secondary | ICD-10-CM

## 2022-10-18 NOTE — Progress Notes (Signed)
Office Visit Note   Patient: Beth Paul           Date of Birth: 1950-11-13           MRN: [id number] Visit Date: 10/18/2022              Requested by: Beth Basques, MD Beth Paul,  [place] [address] PCP: Beth Basques, MD   Assessment & Plan: Visit Diagnoses:  1. Chronic pain of right knee     Plan: Impression is chronic right knee pain.  May have suffered a degenerative medial meniscal tear.  Fortunately symptoms have slightly improved.  Based on options she would like to try cortisone injection for now.  She will let us know how effective this is.  Follow-Up Instructions: No follow-ups on file.   Orders:  Orders Placed This Encounter  Procedures   XR KNEE 3 VIEW RIGHT   No orders of the defined types were placed in this encounter.     Procedures: No procedures performed   Clinical Data: No additional findings.   Subjective: Chief Complaint  Patient presents with   Right Knee - Pain    HPI [name] is a very pleasant 72 year old female wife of Beth Paul who is a long time patient of mine.  She comes in for evaluation of medial right knee pain since July 2023.  She twisted it on a hike and the next day became symptomatic.  Denies any mechanical symptoms.  Pain is worse going down an incline and feels warm at times.  She has tried rest and heat.  Not taking any pain medications.  Takes over-the-counter medications.  States that the pain has somewhat improved. Review of Systems  Constitutional: Negative.   HENT: Negative.    Eyes: Negative.   Respiratory: Negative.    Cardiovascular: Negative.   Endocrine: Negative.   Musculoskeletal: Negative.   Neurological: Negative.   Hematological: Negative.   Psychiatric/Behavioral: Negative.    All other systems reviewed and are negative.    Objective: Vital Signs: There were no vitals taken for this visit.  Physical Exam Vitals and nursing note reviewed.  Constitutional:       Appearance: She is well-developed.  HENT:     Head: Atraumatic.     Nose: Nose normal.  Eyes:     Extraocular Movements: Extraocular movements intact.  Cardiovascular:     Pulses: Normal pulses.  Pulmonary:     Effort: Pulmonary effort is normal.  Abdominal:     Palpations: Abdomen is soft.  Musculoskeletal:     Cervical back: Neck supple.  Skin:    General: Skin is warm.     Capillary Refill: Capillary refill takes less than 2 seconds.  Neurological:     Mental Status: She is alert. Mental status is at baseline.  Psychiatric:        Behavior: Behavior normal.        Thought Content: Thought content normal.        Judgment: Judgment normal.     Ortho Exam Examination of the right knee shows medial joint line tenderness and pain with McMurray's maneuver.  Range of motion well-preserved.  No significant patellofemoral crepitus.  Collaterals and cruciates are stable. Specialty Comments:  No specialty comments available.  Imaging: XR KNEE 3 VIEW RIGHT  Result Date: 10/18/2022 Mild age-appropriate osteoarthritis.  Preserved joint spaces.    PMFS History: Patient Active Problem List   Diagnosis Date Noted   Obstructive sleep  apnea (adult) (pediatric) 08/27/2013   Essential hypertension, benign 08/27/2013   Hypercholesteremia 08/27/2013   Past Medical History:  Diagnosis Date   Fecal incontinence    Dr Burke Keels   Female bladder prolapse    Dr. Ardis Hughs   Hypercholesteremia    Hypertension    OSA (obstructive sleep apnea)    w CPAP at 6cm H2O   Osteoporosis     Family History  Problem Relation Age of Onset   Hypertension Mother    Dementia Mother    Heart disease Father    Hyperlipidemia Father     Past Surgical History:  Procedure Laterality Date   COLONOSCOPY  2/06   INCONTINENCE SURGERY     TONSILLECTOMY AND ADENOIDECTOMY     TUBAL LIGATION     Social History   Occupational History   Not on file  Tobacco Use   Smoking status: Former    Types:  Cigarettes    Quit date: 09/16/1989    Years since quitting: 33.1   Smokeless tobacco: Not on file  Substance and Sexual Activity   Alcohol use: No   Drug use: No   Sexual activity: Not on file

## 2022-10-23 ENCOUNTER — Ambulatory Visit
Admission: RE | Admit: 2022-10-23 | Discharge: 2022-10-23 | Disposition: A | Discharge status: Home / Self Care | Payer: Medicare Other | Source: Ambulatory Visit | Attending: Family Medicine | Admitting: Family Medicine

## 2022-10-23 DIAGNOSIS — Z1231 Encounter for screening mammogram for malignant neoplasm of breast: Secondary | ICD-10-CM

## 2023-09-22 ENCOUNTER — Other Ambulatory Visit: Payer: Self-pay | Admitting: Family Medicine

## 2023-09-22 DIAGNOSIS — Z0000 Encounter for general adult medical examination without abnormal findings: Secondary | ICD-10-CM

## 2023-10-29 ENCOUNTER — Ambulatory Visit
Admission: RE | Admit: 2023-10-29 | Discharge: 2023-10-29 | Disposition: A | Discharge status: Home / Self Care | Payer: Medicare PPO | Source: Ambulatory Visit | Attending: Family Medicine | Admitting: Family Medicine

## 2023-10-29 DIAGNOSIS — Z1231 Encounter for screening mammogram for malignant neoplasm of breast: Secondary | ICD-10-CM | POA: Diagnosis not present

## 2023-10-29 DIAGNOSIS — Z0000 Encounter for general adult medical examination without abnormal findings: Secondary | ICD-10-CM

## 2023-10-29 DIAGNOSIS — H5213 Myopia, bilateral: Secondary | ICD-10-CM | POA: Diagnosis not present

## 2023-10-29 DIAGNOSIS — Z961 Presence of intraocular lens: Secondary | ICD-10-CM | POA: Diagnosis not present

## 2023-10-29 DIAGNOSIS — H353131 Nonexudative age-related macular degeneration, bilateral, early dry stage: Secondary | ICD-10-CM | POA: Diagnosis not present

## 2024-02-04 DIAGNOSIS — G4733 Obstructive sleep apnea (adult) (pediatric): Secondary | ICD-10-CM | POA: Diagnosis not present

## 2024-06-02 ENCOUNTER — Other Ambulatory Visit (HOSPITAL_BASED_OUTPATIENT_CLINIC_OR_DEPARTMENT_OTHER): Payer: Self-pay | Admitting: Family Medicine

## 2024-06-02 DIAGNOSIS — E669 Obesity, unspecified: Secondary | ICD-10-CM | POA: Diagnosis not present

## 2024-06-02 DIAGNOSIS — I10 Essential (primary) hypertension: Secondary | ICD-10-CM | POA: Diagnosis not present

## 2024-06-02 DIAGNOSIS — Z0000 Encounter for general adult medical examination without abnormal findings: Secondary | ICD-10-CM | POA: Diagnosis not present

## 2024-06-02 DIAGNOSIS — N951 Menopausal and female climacteric states: Secondary | ICD-10-CM | POA: Diagnosis not present

## 2024-06-02 DIAGNOSIS — M810 Age-related osteoporosis without current pathological fracture: Secondary | ICD-10-CM | POA: Diagnosis not present

## 2024-06-02 DIAGNOSIS — E782 Mixed hyperlipidemia: Secondary | ICD-10-CM

## 2024-06-02 DIAGNOSIS — R7303 Prediabetes: Secondary | ICD-10-CM | POA: Diagnosis not present

## 2024-06-02 DIAGNOSIS — G4700 Insomnia, unspecified: Secondary | ICD-10-CM | POA: Diagnosis not present

## 2024-06-02 DIAGNOSIS — G4733 Obstructive sleep apnea (adult) (pediatric): Secondary | ICD-10-CM | POA: Diagnosis not present

## 2024-06-02 DIAGNOSIS — R6889 Other general symptoms and signs: Secondary | ICD-10-CM | POA: Diagnosis not present

## 2024-06-09 ENCOUNTER — Ambulatory Visit (INDEPENDENT_AMBULATORY_CARE_PROVIDER_SITE_OTHER)
Admission: RE | Admit: 2024-06-09 | Discharge: 2024-06-09 | Disposition: A | Discharge status: Home / Self Care | Payer: Self-pay | Source: Ambulatory Visit | Attending: Family Medicine | Admitting: Family Medicine

## 2024-06-09 DIAGNOSIS — E782 Mixed hyperlipidemia: Secondary | ICD-10-CM

## 2024-06-15 ENCOUNTER — Other Ambulatory Visit (HOSPITAL_BASED_OUTPATIENT_CLINIC_OR_DEPARTMENT_OTHER): Payer: Self-pay | Admitting: Family Medicine

## 2024-06-15 DIAGNOSIS — M810 Age-related osteoporosis without current pathological fracture: Secondary | ICD-10-CM

## 2024-06-15 DIAGNOSIS — Z13820 Encounter for screening for osteoporosis: Secondary | ICD-10-CM

## 2024-07-05 ENCOUNTER — Ambulatory Visit (INDEPENDENT_AMBULATORY_CARE_PROVIDER_SITE_OTHER)
Admission: RE | Admit: 2024-07-05 | Discharge: 2024-07-05 | Disposition: A | Discharge status: Home / Self Care | Source: Ambulatory Visit | Attending: Family Medicine | Admitting: Family Medicine

## 2024-07-05 DIAGNOSIS — M810 Age-related osteoporosis without current pathological fracture: Secondary | ICD-10-CM

## 2024-07-05 DIAGNOSIS — Z13820 Encounter for screening for osteoporosis: Secondary | ICD-10-CM | POA: Diagnosis not present

## 2024-07-05 DIAGNOSIS — Z780 Asymptomatic menopausal state: Secondary | ICD-10-CM | POA: Diagnosis not present

## 2024-07-20 ENCOUNTER — Other Ambulatory Visit (INDEPENDENT_AMBULATORY_CARE_PROVIDER_SITE_OTHER): Payer: Self-pay

## 2024-07-20 ENCOUNTER — Ambulatory Visit: Admitting: Orthopaedic Surgery

## 2024-07-20 DIAGNOSIS — G8929 Other chronic pain: Secondary | ICD-10-CM

## 2024-07-20 DIAGNOSIS — M25561 Pain in right knee: Secondary | ICD-10-CM

## 2024-07-20 NOTE — Addendum Note (Signed)
 Addended by: Glyndon Tursi on: 07/20/2024 03:18 PM   Modules accepted: Orders

## 2024-07-20 NOTE — Progress Notes (Signed)
   Office Visit Note   Patient: Beth Paul           Date of Birth: 1951-08-20           MRN: [id number] Visit Date: 07/20/2024              Requested by: Luiz Channel, MD [address] [place],  [place] [address] PCP: Luiz Channel, MD   Assessment & Plan: Visit Diagnoses:  1. Chronic pain of right knee     Plan: History of Present Illness Beth Paul is a 73 year old female who presents with persistent knee pain.  She experiences persistent medial knee pain, unchanged since her last visit. The pain worsens with lateral or pivoting movements, limiting her activity choices. She is concerned about potential falls due to the knee 'giving way' with certain motions. There are no clicking or locking sensations. During her last visit, she was offered an MRI but does not recall receiving a cortisone injection.  Exam of right knee shows pain joint medial joint line and medial retinaculum.  No effusion.    Assessment and Plan Right knee pain with suspected meniscus tear and osteoarthritis Chronic medial knee pain with suspected meniscus tear and osteoarthritis. Reports instability, increasing fall risk. MRI required for confirmation and assessment. - Ordered MRI of the right knee.  Follow-Up Instructions: No follow-ups on file.   Orders:  Orders Placed This Encounter  Procedures   XR KNEE 3 VIEW RIGHT   No orders of the defined types were placed in this encounter.     Procedures: No procedures performed   Clinical Data: No additional findings.   Subjective: Chief Complaint  Patient presents with   Right Knee - Pain     Social History   Occupational History   Not on file  Tobacco Use   Smoking status: Former    Current packs/day: 0.00    Types: Cigarettes    Quit date: 09/16/1989    Years since quitting: 34.8   Smokeless tobacco: Not on file  Substance and Sexual Activity   Alcohol use: No   Drug use: No   Sexual activity: Not on file

## 2024-07-22 ENCOUNTER — Encounter: Payer: Self-pay | Admitting: Orthopaedic Surgery

## 2024-07-27 ENCOUNTER — Inpatient Hospital Stay
Admission: RE | Admit: 2024-07-27 | Discharge: 2024-07-27 | Attending: Orthopaedic Surgery | Admitting: Orthopaedic Surgery

## 2024-07-27 DIAGNOSIS — G8929 Other chronic pain: Secondary | ICD-10-CM

## 2024-08-04 ENCOUNTER — Ambulatory Visit (HOSPITAL_COMMUNITY)
Admission: RE | Admit: 2024-08-04 | Discharge: 2024-08-04 | Disposition: A | Discharge status: Home / Self Care | Source: Ambulatory Visit | Attending: Orthopaedic Surgery | Admitting: Orthopaedic Surgery

## 2024-08-04 DIAGNOSIS — G8929 Other chronic pain: Secondary | ICD-10-CM | POA: Diagnosis not present

## 2024-08-04 DIAGNOSIS — S83231A Complex tear of medial meniscus, current injury, right knee, initial encounter: Secondary | ICD-10-CM | POA: Diagnosis not present

## 2024-08-04 DIAGNOSIS — M25561 Pain in right knee: Secondary | ICD-10-CM | POA: Diagnosis not present

## 2024-08-06 ENCOUNTER — Other Ambulatory Visit

## 2024-08-11 ENCOUNTER — Telehealth: Payer: Self-pay

## 2024-08-11 ENCOUNTER — Ambulatory Visit: Admitting: Orthopaedic Surgery

## 2024-08-11 DIAGNOSIS — M1711 Unilateral primary osteoarthritis, right knee: Secondary | ICD-10-CM

## 2024-08-11 DIAGNOSIS — S83241A Other tear of medial meniscus, current injury, right knee, initial encounter: Secondary | ICD-10-CM

## 2024-08-11 NOTE — Progress Notes (Signed)
 Office Visit Note   Patient: Beth Paul           Date of Birth: Jul 02, 1951           MRN: [id number] Visit Date: 08/11/2024              Requested by: Luiz Channel, MD [address] [place],  [place] [address] PCP: Loreli Kins, MD   Assessment & Plan: Visit Diagnoses:  1. Primary osteoarthritis of right knee   2. Acute medial meniscus tear of right knee, initial encounter     Plan: History of Present Illness Beth Paul is a 73 year old female who presents for MRI review of the right knee.  MRI shows advanced chondromalacia of the medial and patellofemoral compartments.  There is a complex tear of the medial meniscus posterior horn.  Exam of the right knee shows pain and crepitus with range of motion.  Medial joint line tenderness.  Assessment and Plan Right knee primary osteoarthritis with medial meniscus tear MRI showed medial meniscus tear and severe cartilage erosion in the medial compartment. Arthroscopic surgery may provide short-term relief. Total knee replacement recommended for reliable long-term relief. - Obtain surgical clearance from Dr. Loreli. - Schedule right total knee replacement with Debbie. - Provided contact information for Debbie.  Impression is severe right knee degenerative joint disease secondary to Osteoarthritis.  Patient has attempted conservative treatment for at least 6 consecutive weeks within the past 12 weeks, including but not limited to physical therapy, home exercise program, NSAIDs, activity modification, and/or corticosteroid injections. Despite these efforts, symptoms have not improved or have worsened. Conservative measures have been deemed unsuccessful at this time. After a detailed discussion covering diagnosis and treatment options--including the risks, benefits, alternatives, and potential complications of surgical and nonsurgical management--the patient elected to proceed with surgery  Anticoagulants: No  antithrombotic Postop anticoagulation: Eliquis Diabetic: No  Nickel allergy: No Prior DVT/PE: No Tobacco use: No Clearances needed for surgery: PCP Anticipated discharge dispo: Home with husband   Follow-Up Instructions: No follow-ups on file.   Orders:  No orders of the defined types were placed in this encounter.  No orders of the defined types were placed in this encounter.     Procedures: No procedures performed   Clinical Data: No additional findings.   Subjective: Chief Complaint  Patient presents with   Right Knee - Pain    HPI  Review of Systems  Constitutional: Negative.   HENT: Negative.    Eyes: Negative.   Respiratory: Negative.    Cardiovascular: Negative.   Endocrine: Negative.   Musculoskeletal: Negative.   Neurological: Negative.   Hematological: Negative.   Psychiatric/Behavioral: Negative.    All other systems reviewed and are negative.    Objective: Vital Signs: There were no vitals taken for this visit.  Physical Exam Vitals and nursing note reviewed.  Constitutional:      Appearance: She is well-developed.  HENT:     Head: Atraumatic.     Nose: Nose normal.  Eyes:     Extraocular Movements: Extraocular movements intact.  Cardiovascular:     Pulses: Normal pulses.  Pulmonary:     Effort: Pulmonary effort is normal.  Abdominal:     Palpations: Abdomen is soft.  Musculoskeletal:     Cervical back: Neck supple.  Skin:    General: Skin is warm.     Capillary Refill: Capillary refill takes less than 2 seconds.  Neurological:     Mental  Status: She is alert. Mental status is at baseline.  Psychiatric:        Behavior: Behavior normal.        Thought Content: Thought content normal.        Judgment: Judgment normal.     Ortho Exam  Specialty Comments:  No specialty comments available.  Imaging: No results found.   PMFS History: Patient Active Problem List   Diagnosis Date Noted   Obstructive sleep apnea (adult)  (pediatric) 08/27/2013   Essential hypertension, benign 08/27/2013   Hypercholesteremia 08/27/2013   Past Medical History:  Diagnosis Date   Fecal incontinence    Dr Roni   Female bladder prolapse    Dr. Teressa   Hypercholesteremia    Hypertension    OSA (obstructive sleep apnea)    w CPAP at 6cm H2O   Osteoporosis     Family History  Problem Relation Age of Onset   Hypertension Mother    Dementia Mother    Heart disease Father    Hyperlipidemia Father     Past Surgical History:  Procedure Laterality Date   COLONOSCOPY  2/06   INCONTINENCE SURGERY     TONSILLECTOMY AND ADENOIDECTOMY     TUBAL LIGATION     Social History   Occupational History   Not on file  Tobacco Use   Smoking status: Former    Current packs/day: 0.00    Types: Cigarettes    Quit date: 09/16/1989    Years since quitting: 34.9   Smokeless tobacco: Not on file  Substance and Sexual Activity   Alcohol use: No   Drug use: No   Sexual activity: Not on file

## 2024-08-11 NOTE — Telephone Encounter (Signed)
 Patient was given a clearance form to take to PCP for future R TKA.

## 2024-09-22 NOTE — Progress Notes (Signed)
 Surgery orders requested via Epic inbox.

## 2024-09-23 ENCOUNTER — Other Ambulatory Visit: Payer: Self-pay | Admitting: Physician Assistant

## 2024-09-23 MED ORDER — ONDANSETRON HCL 4 MG PO TABS: 4.0000 mg | ORAL_TABLET | Freq: Three times a day (TID) | ORAL | 0 refills | Status: AC | PRN

## 2024-09-23 MED ORDER — METHOCARBAMOL 500 MG PO TABS: 500.0000 mg | ORAL_TABLET | Freq: Two times a day (BID) | ORAL | 1 refills | Status: AC | PRN

## 2024-09-23 MED ORDER — DOCUSATE SODIUM 100 MG PO CAPS: 100.0000 mg | ORAL_CAPSULE | Freq: Every day | ORAL | 2 refills | Status: AC | PRN

## 2024-09-23 MED ORDER — OXYCODONE-ACETAMINOPHEN 5-325 MG PO TABS: 1.0000 | ORAL_TABLET | Freq: Four times a day (QID) | ORAL | 0 refills | Status: AC | PRN

## 2024-09-28 ENCOUNTER — Other Ambulatory Visit: Payer: Self-pay | Admitting: Physician Assistant

## 2024-09-28 MED ORDER — APIXABAN 2.5 MG PO TABS: ORAL_TABLET | ORAL | 0 refills | Status: AC

## 2024-09-28 NOTE — Patient Instructions (Signed)
 SURGICAL WAITING ROOM VISITATION Patients having surgery or a procedure may have no more than 2 support people in the waiting area - these visitors may rotate in the visitor waiting room.   Due to an increase in RSV and influenza rates and associated hospitalizations, children ages 46 and under may not visit patients in [facility] hospitals. If the patient needs to stay at the hospital during part of their recovery, the visitor guidelines for inpatient rooms apply.  PRE-OP VISITATION  Pre-op nurse will coordinate an appropriate time for 1 support person to accompany the patient in pre-op.  This support person may not rotate.  This visitor will be contacted when the time is appropriate for the visitor to come back in the pre-op area.  Please refer to the [facility] website for the visitor guidelines for Inpatients (after your surgery is over and you are in a regular room).  You are not required to quarantine at this time prior to your surgery. However, you must do this: Hand Hygiene often Do NOT share personal items Notify your provider if you are in close contact with someone who has COVID or you develop fever 100.4 or greater, new onset of sneezing, cough, sore throat, shortness of breath or body aches.  If you test positive for Covid or have been in contact with anyone that has tested positive in the last 10 days please notify you surgeon.    Your procedure is scheduled on: 10/08/24   Report to [facility] Main Entrance: [place] entrance where the [employer] is available.   Report to admitting at:5:15 AM  Call this number if you have any questions or problems the morning of surgery [phone]  FOLLOW ANY ADDITIONAL PRE OP INSTRUCTIONS YOU RECEIVED FROM YOUR SURGEON'S OFFICE!!!  Do not eat food after Midnight the night prior to your surgery/procedure.  After Midnight you may have the following liquids until: 4:30 AM DAY OF SURGERY  Clear Liquid Diet Water Black  Coffee (sugar ok, NO MILK/CREAM OR CREAMERS)  Tea (sugar ok, NO MILK/CREAM OR CREAMERS) regular and decaf                             Plain Jell-O  with no fruit (NO RED)                                           Fruit ices (not with fruit pulp, NO RED)                                     Popsicles (NO RED)                                                                  Juice: NO CITRUS JUICES: only apple, WHITE grape, WHITE cranberry Sports drinks like Gatorade or Powerade (NO RED)   The day of surgery:  Drink ONE (1) Pre-Surgery Clear Ensure at : 4:30 AM the morning of surgery. Drink in one sitting. Do not sip.  This drink was given to  you during your hospital pre-op appointment visit. Nothing else to drink after completing the Pre-Surgery Clear Ensure or G2 : No candy, chewing gum or throat lozenges.    Oral Hygiene is also important to reduce your risk of infection.        Remember - BRUSH YOUR TEETH THE MORNING OF SURGERY WITH YOUR REGULAR TOOTHPASTE  Do NOT smoke after Midnight the night before surgery.  STOP TAKING all Vitamins, Herbs and supplements 1 week before your surgery.   Take ONLY these medicines the morning of surgery with A SIP OF WATER: nifedipine.  If You have been diagnosed with Sleep Apnea - Bring CPAP mask and tubing day of surgery. We will provide you with a CPAP machine on the day of your surgery.                   You may not have any metal on your body including hair pins, jewelry, and body piercing  Do not wear make-up, lotions, powders, perfumes / cologne, or deodorant  Do not wear nail polish including gel and S&S, artificial / acrylic nails, or any other type of covering on natural nails including finger and toenails. If you have artificial nails, gel coating, etc., that needs to be removed by a nail salon, Please have this removed prior to surgery. Not doing so may mean that your surgery could be cancelled or delayed if the Surgeon or anesthesia staff  feels like they are unable to monitor you safely.   Do not shave 48 hours prior to surgery to avoid nicks in your skin which may contribute to postoperative infections.   Contacts, Hearing Aids, dentures or bridgework may not be worn into surgery. DENTURES WILL BE REMOVED PRIOR TO SURGERY PLEASE DO NOT APPLY Poly grip OR ADHESIVES!!!  You may bring a small overnight bag with you on the day of surgery, only pack items that are not valuable. [facility] IS NOT RESPONSIBLE   FOR VALUABLES THAT ARE LOST OR STOLEN.   Patients discharged on the day of surgery will not be allowed to drive home.  Someone NEEDS to stay with you for the first 24 hours after anesthesia.  Do not bring your home medications to the hospital. The Pharmacy will dispense medications listed on your medication list to you during your admission in the Hospital.  Special Instructions: Bring a copy of your healthcare power of attorney and living will documents the day of surgery, if you wish to have them scanned into your [facility]- EPIC  Please read over the following fact sheets you were given: IF YOU HAVE QUESTIONS ABOUT YOUR PRE-OP INSTRUCTIONS, PLEASE CALL [phone]  PATIENT SIGNATURE_________________________________  NURSE SIGNATURE__________________________________  ________________________________________________________________________  Pre-operative 4 CHG Bath Instructions  DYNA-Hex 4 Chlorhexidine Gluconate 4% Solution Antiseptic 4 fl. oz   You can play a key role in reducing the risk of infection after surgery. Your skin needs to be as free of germs as possible. You can reduce the number of germs on your skin by washing with CHG (chlorhexidine gluconate) soap before surgery. CHG is an antiseptic soap that kills germs and continues to kill germs even after washing.   DO NOT use if you have an allergy to chlorhexidine/CHG or antibacterial soaps. If your skin becomes reddened or irritated, stop  using the CHG and notify one of our RNs at   Please shower with the CHG soap starting 4 days before surgery using the following schedule:  Please keep in mind the following:  DO NOT shave, including legs and underarms, starting the day of your first shower.   You may shave your face at any point before/day of surgery.  Place clean sheets on your bed the day you start using CHG soap. Use a clean washcloth (not used since being washed) for each shower. DO NOT sleep with pets once you start using the CHG.  CHG Shower Instructions:  If you choose to wash your hair and private area, wash first with your normal shampoo/soap.  After you use shampoo/soap, rinse your hair and body thoroughly to remove shampoo/soap residue.  Turn the water OFF and apply about 3 tablespoons (45 ml) of CHG soap to a CLEAN washcloth.  Apply CHG soap ONLY FROM YOUR NECK DOWN TO YOUR TOES (washing for 3-5 minutes)  DO NOT use CHG soap on face, private areas, open wounds, or sores.  Pay special attention to the area where your surgery is being performed.  If you are having back surgery, having someone wash your back for you may be helpful. Wait 2 minutes after CHG soap is applied, then you may rinse off the CHG soap.  Pat dry with a clean towel  Put on clean clothes/pajamas   If you choose to wear lotion, please use ONLY the CHG-compatible lotions on the back of this paper.     Additional instructions for the day of surgery: DO NOT APPLY any lotions, deodorants, cologne, or perfumes.   Put on clean/comfortable clothes.  Brush your teeth.  Ask your nurse before applying any prescription medications to the skin.   CHG Compatible Lotions   Aveeno Moisturizing lotion  Cetaphil Moisturizing Cream  Cetaphil Moisturizing Lotion  Clairol Herbal Essence Moisturizing Lotion, Dry Skin  Clairol Herbal Essence Moisturizing Lotion, Extra Dry Skin  Clairol Herbal Essence Moisturizing Lotion, Normal Skin  Curel Age  Defying Therapeutic Moisturizing Lotion with Alpha Hydroxy  Curel Extreme Care Body Lotion  Curel Soothing Hands Moisturizing Hand Lotion  Curel Therapeutic Moisturizing Cream, Fragrance-Free  Curel Therapeutic Moisturizing Lotion, Fragrance-Free  Curel Therapeutic Moisturizing Lotion, Original Formula  Eucerin Daily Replenishing Lotion  Eucerin Dry Skin Therapy Plus Alpha Hydroxy Crme  Eucerin Dry Skin Therapy Plus Alpha Hydroxy Lotion  Eucerin Original Crme  Eucerin Original Lotion  Eucerin Plus Crme Eucerin Plus Lotion  Eucerin TriLipid Replenishing Lotion  Keri Anti-Bacterial Hand Lotion  Keri Deep Conditioning Original Lotion Dry Skin Formula Softly Scented  Keri Deep Conditioning Original Lotion, Fragrance Free Sensitive Skin Formula  Keri Lotion Fast Absorbing Fragrance Free Sensitive Skin Formula  Keri Lotion Fast Absorbing Softly Scented Dry Skin Formula  Keri Original Lotion  Keri Skin Renewal Lotion Keri Silky Smooth Lotion  Keri Silky Smooth Sensitive Skin Lotion  Nivea Body Creamy Conditioning Oil  Nivea Body Extra Enriched Lotion  Nivea Body Original Lotion  Nivea Body Sheer Moisturizing Lotion Nivea Crme  Nivea Skin Firming Lotion  NutraDerm 30 Skin Lotion  NutraDerm Skin Lotion  NutraDerm Therapeutic Skin Cream  NutraDerm Therapeutic Skin Lotion  ProShield Protective Hand Cream  Provon moisturizing lotion  Incentive Spirometer  An incentive spirometer is a tool that can help keep your lungs clear and active. This tool measures how well you are filling your lungs with each breath. Taking long deep breaths may help reverse or decrease the chance of developing breathing (pulmonary) problems (especially infection) following: A long period of time when you are unable to move or be active. BEFORE THE PROCEDURE  If  the spirometer includes an indicator to show your best effort, your nurse or respiratory therapist will set it to a desired goal. If possible, sit up  straight or lean slightly forward. Try not to slouch. Hold the incentive spirometer in an upright position. INSTRUCTIONS FOR USE  Sit on the edge of your bed if possible, or sit up as far as you can in bed or on a chair. Hold the incentive spirometer in an upright position. Breathe out normally. Place the mouthpiece in your mouth and seal your lips tightly around it. Breathe in slowly and as deeply as possible, raising the piston or the ball toward the top of the column. Hold your breath for 3-5 seconds or for as long as possible. Allow the piston or ball to fall to the bottom of the column. Remove the mouthpiece from your mouth and breathe out normally. Rest for a few seconds and repeat Steps 1 through 7 at least 10 times every 1-2 hours when you are awake. Take your time and take a few normal breaths between deep breaths. The spirometer may include an indicator to show your best effort. Use the indicator as a goal to work toward during each repetition. After each set of 10 deep breaths, practice coughing to be sure your lungs are clear. If you have an incision (the cut made at the time of surgery), support your incision when coughing by placing a pillow or rolled up towels firmly against it. Once you are able to get out of bed, walk around indoors and cough well. You may stop using the incentive spirometer when instructed by your caregiver.  RISKS AND COMPLICATIONS Take your time so you do not get dizzy or light-headed. If you are in pain, you may need to take or ask for pain medication before doing incentive spirometry. It is harder to take a deep breath if you are having pain. AFTER USE Rest and breathe slowly and easily. It can be helpful to keep track of a log of your progress. Your caregiver can provide you with a simple table to help with this. If you are using the spirometer at home, follow these instructions: SEEK MEDICAL CARE IF:  You are having difficultly using the spirometer. You  have trouble using the spirometer as often as instructed. Your pain medication is not giving enough relief while using the spirometer. You develop fever of 100.5 F (38.1 C) or higher. SEEK IMMEDIATE MEDICAL CARE IF:  You cough up bloody sputum that had not been present before. You develop fever of 102 F (38.9 C) or greater. You develop worsening pain at or near the incision site. MAKE SURE YOU:  Understand these instructions. Will watch your condition. Will get help right away if you are not doing well or get worse. Document Released: 01/13/2007 Document Revised: 11/25/2011 Document Reviewed: 03/16/2007 [facility] Patient Information 2014 [place], [place].   ________________________________________________________________________

## 2024-09-29 ENCOUNTER — Other Ambulatory Visit: Payer: Self-pay

## 2024-09-29 ENCOUNTER — Encounter (HOSPITAL_COMMUNITY): Payer: Self-pay

## 2024-09-29 ENCOUNTER — Encounter (HOSPITAL_COMMUNITY)
Admission: RE | Admit: 2024-09-29 | Discharge: 2024-09-29 | Disposition: A | Discharge status: Home / Self Care | Source: Ambulatory Visit | Attending: Orthopaedic Surgery | Admitting: Orthopaedic Surgery

## 2024-09-29 VITALS — BP 151/101 | HR 80 | Temp 98.5°F | Ht 66.0 in | Wt 199.0 lb

## 2024-09-29 DIAGNOSIS — Z01818 Encounter for other preprocedural examination: Secondary | ICD-10-CM | POA: Insufficient documentation

## 2024-09-29 DIAGNOSIS — M1711 Unilateral primary osteoarthritis, right knee: Secondary | ICD-10-CM | POA: Insufficient documentation

## 2024-09-29 DIAGNOSIS — I10 Essential (primary) hypertension: Secondary | ICD-10-CM | POA: Insufficient documentation

## 2024-09-29 HISTORY — DX: Other complications of anesthesia, initial encounter: T88.59XA

## 2024-09-29 HISTORY — DX: Nausea with vomiting, unspecified: R11.2

## 2024-09-29 HISTORY — DX: Unspecified osteoarthritis, unspecified site: M19.90

## 2024-09-29 HISTORY — DX: Malignant (primary) neoplasm, unspecified: C80.1

## 2024-09-29 LAB — SURGICAL PCR SCREEN
MRSA, PCR: NEGATIVE
Staphylococcus aureus: POSITIVE — AB

## 2024-09-29 LAB — CBC
HCT: 48.1 % — ABNORMAL HIGH (ref 36.0–46.0)
Hemoglobin: 15.8 g/dL — ABNORMAL HIGH (ref 12.0–15.0)
MCH: 30.9 pg (ref 26.0–34.0)
MCHC: 32.8 g/dL (ref 30.0–36.0)
MCV: 94.1 fL (ref 80.0–100.0)
Platelets: 254 K/uL (ref 150–400)
RBC: 5.11 MIL/uL (ref 3.87–5.11)
RDW: 13.2 % (ref 11.5–15.5)
WBC: 8.4 K/uL (ref 4.0–10.5)
nRBC: 0 % (ref 0.0–0.2)

## 2024-09-29 LAB — BASIC METABOLIC PANEL WITH GFR
Anion gap: 10 (ref 5–15)
BUN: 12 mg/dL (ref 8–23)
CO2: 25 mmol/L (ref 22–32)
Calcium: 9.6 mg/dL (ref 8.9–10.3)
Chloride: 105 mmol/L (ref 98–111)
Creatinine, Ser: 0.66 mg/dL (ref 0.44–1.00)
GFR, Estimated: >60 mL/min
Glucose, Bld: 103 mg/dL — ABNORMAL HIGH (ref 70–99)
Potassium: 4.4 mmol/L (ref 3.5–5.1)
Sodium: 139 mmol/L (ref 135–145)

## 2024-09-29 NOTE — Progress Notes (Signed)
 For Anesthesia: PCP - Loreli Kins, MD  Cardiologist - N/A  Bowel Prep reminder:  Chest x-ray -  EKG - 09/29/24 Stress Test -  ECHO -  Cardiac Cath -  Pacemaker/ICD device last checked: Pacemaker orders received: Device Rep notified:  Spinal Cord Stimulator:N/A . Fecal incontinence stimulator: Left hip. Pt. Will bring control.  Sleep Study - Yes CPAP - Yes  Fasting Blood Sugar - N/A Checks Blood Sugar _____ times a day Date and result of last Hgb A1c-  Last dose of GLP1 agonist- N/A GLP1 instructions: Hold 7 days prior to schedule (Hold 24 hours-daily)   Last dose of SGLT-2 inhibitors- N/A SGLT-2 instructions: Hold 72 hours prior to surgery  Blood Thinner Instructions:N/A Last Dose: Time last taken:  Aspirin Instructions: Last Dose: Time last taken:  Activity level: Can go up a flight of stairs and activities of daily living without stopping and without chest pain and/or shortness of breath   Able to exercise without chest pain and/or shortness of breath  Anesthesia review: Hx: HTN,OSA(CPAP).  Patient denies shortness of breath, fever, cough and chest pain at PAT appointment   Patient verbalized understanding of instructions that were reviewed over the telephone.

## 2024-09-30 NOTE — Progress Notes (Signed)
 PCR: + STAPH.

## 2024-10-07 ENCOUNTER — Other Ambulatory Visit: Payer: Self-pay | Admitting: Family Medicine

## 2024-10-07 DIAGNOSIS — M1711 Unilateral primary osteoarthritis, right knee: Secondary | ICD-10-CM | POA: Insufficient documentation

## 2024-10-07 DIAGNOSIS — Z1231 Encounter for screening mammogram for malignant neoplasm of breast: Secondary | ICD-10-CM

## 2024-10-07 MED ORDER — TRANEXAMIC ACID 1000 MG/10ML IV SOLN
2000.0000 mg | INTRAVENOUS | Status: DC
  Filled 2024-10-07: qty 20

## 2024-10-07 NOTE — Care Plan (Signed)
 Ortho Bundle Case Management Note  Patient Details  Name: Beth Paul MRN: [id number] Date of Birth: [birth year]   [facility] RNCM call to patient and discussed her upcoming Right total knee arthroplasty with Dr. Jerri on 10/08/24 at [facility]. She is agreeable to case management. She plans to return home with assistance from her husband. She does have a RW and will be contacted by [employer] for delivery of her CPM. Anticipate HHPT may be needed after a short hospital stay. Referral made to [facility] after choice provided. Reviewed post op care instructions and will continue to follow for needs.                  DME Arranged:  CPM (Patient has RW already) DME Agency:  [employer]  HH Arranged:  PT HH Agency:  [facility]  Additional Comments: Please contact me with any questions of if this plan should need to change.  Tylene Ned, RN, BSN, [employer]  [phone] 10/07/2024, 10:17 AM

## 2024-10-07 NOTE — Anesthesia Preprocedure Evaluation (Signed)
"                                    Anesthesia Evaluation  Patient identified by MRN, date of birth, ID band Patient awake    Reviewed: Allergy & Precautions, NPO status , Patient's Chart, lab work & pertinent test results  History of Anesthesia Complications (+) PONV  Airway Mallampati: II  TM Distance: >3 FB Neck ROM: Full    Dental  (+) Dental Advisory Given   Pulmonary sleep apnea and Continuous Positive Airway Pressure Ventilation , former smoker   breath sounds clear to auscultation       Cardiovascular hypertension, Pt. on medications (-) angina  Rhythm:Regular Rate:Normal     Neuro/Psych negative neurological ROS     GI/Hepatic negative GI ROS, Neg liver ROS,,,  Endo/Other  BMI 32  Renal/GU negative Renal ROS     Musculoskeletal  (+) Arthritis ,    Abdominal   Peds  Hematology Hb 15.8, plt 254k   Anesthesia Other Findings   Reproductive/Obstetrics                              Anesthesia Physical Anesthesia Plan  ASA: 3  Anesthesia Plan: Spinal   Post-op Pain Management: Regional block* and Tylenol  PO (pre-op)*   Induction:   PONV Risk Score and Plan: 3 and Treatment may vary due to age or medical condition  Airway Management Planned: Natural Airway and Simple Face Mask  Additional Equipment:   Intra-op Plan:   Post-operative Plan:   Informed Consent: I have reviewed the patients History and Physical, chart, labs and discussed the procedure including the risks, benefits and alternatives for the proposed anesthesia with the patient or authorized representative who has indicated his/her understanding and acceptance.     Dental advisory given  Plan Discussed with: CRNA and Surgeon  Anesthesia Plan Comments: (SAB with adductor canal block for post op analgesia)         Anesthesia Quick Evaluation  "

## 2024-10-08 ENCOUNTER — Observation Stay (HOSPITAL_COMMUNITY)
Admission: RE | Admit: 2024-10-08 | Discharge: 2024-10-09 | Disposition: A | Source: Ambulatory Visit | Attending: Orthopaedic Surgery | Admitting: Orthopaedic Surgery

## 2024-10-08 ENCOUNTER — Ambulatory Visit (HOSPITAL_COMMUNITY): Payer: Self-pay | Admitting: Anesthesiology

## 2024-10-08 ENCOUNTER — Other Ambulatory Visit: Payer: Self-pay

## 2024-10-08 ENCOUNTER — Encounter (HOSPITAL_COMMUNITY): Admission: RE | Payer: Self-pay | Source: Ambulatory Visit

## 2024-10-08 ENCOUNTER — Observation Stay (HOSPITAL_COMMUNITY)

## 2024-10-08 ENCOUNTER — Encounter (HOSPITAL_COMMUNITY): Payer: Self-pay | Admitting: Orthopaedic Surgery

## 2024-10-08 DIAGNOSIS — Z87891 Personal history of nicotine dependence: Secondary | ICD-10-CM | POA: Insufficient documentation

## 2024-10-08 DIAGNOSIS — I10 Essential (primary) hypertension: Secondary | ICD-10-CM | POA: Diagnosis not present

## 2024-10-08 DIAGNOSIS — Z96651 Presence of right artificial knee joint: Secondary | ICD-10-CM

## 2024-10-08 DIAGNOSIS — M1711 Unilateral primary osteoarthritis, right knee: Secondary | ICD-10-CM | POA: Diagnosis not present

## 2024-10-08 DIAGNOSIS — Z85828 Personal history of other malignant neoplasm of skin: Secondary | ICD-10-CM | POA: Insufficient documentation

## 2024-10-08 DIAGNOSIS — Z7982 Long term (current) use of aspirin: Secondary | ICD-10-CM | POA: Diagnosis not present

## 2024-10-08 DIAGNOSIS — Z79899 Other long term (current) drug therapy: Secondary | ICD-10-CM | POA: Insufficient documentation

## 2024-10-08 DIAGNOSIS — G4730 Sleep apnea, unspecified: Secondary | ICD-10-CM | POA: Diagnosis not present

## 2024-10-08 MED ORDER — STERILE WATER FOR IRRIGATION IR SOLN
Status: DC | PRN
  Administered 2024-10-08: 2000 mL

## 2024-10-08 MED ORDER — MIDAZOLAM HCL (PF) 2 MG/2ML IJ SOLN
INTRAMUSCULAR | Status: DC | PRN
  Administered 2024-10-08: 1 mg via INTRAVENOUS

## 2024-10-08 MED ORDER — OXYCODONE HCL 5 MG PO TABS
5.0000 mg | ORAL_TABLET | Freq: Four times a day (QID) | ORAL | Status: DC | PRN
  Administered 2024-10-08 – 2024-10-09 (×5): 5 mg via ORAL
  Filled 2024-10-08 (×5): qty 1

## 2024-10-08 MED ORDER — MIDAZOLAM HCL 2 MG/2ML IJ SOLN
INTRAMUSCULAR | Status: AC
  Filled 2024-10-08: qty 2

## 2024-10-08 MED ORDER — LACTATED RINGERS IV SOLN: INTRAVENOUS | Status: DC

## 2024-10-08 MED ORDER — OXYCODONE HCL 5 MG/5ML PO SOLN: 5.0000 mg | Freq: Once | ORAL | Status: DC | PRN

## 2024-10-08 MED ORDER — HYDROMORPHONE HCL 1 MG/ML IJ SOLN
0.2500 mg | INTRAMUSCULAR | Status: DC | PRN
  Administered 2024-10-08: 0.5 mg via INTRAVENOUS

## 2024-10-08 MED ORDER — PROPOFOL 500 MG/50ML IV EMUL
INTRAVENOUS | Status: DC | PRN
  Administered 2024-10-08: 75 ug/kg/min via INTRAVENOUS
  Administered 2024-10-08: 30 mg via INTRAVENOUS

## 2024-10-08 MED ORDER — CHLORHEXIDINE GLUCONATE 4 % EX SOLN: 1.0000 | CUTANEOUS | 1 refills | Status: AC

## 2024-10-08 MED ORDER — SODIUM CHLORIDE 0.9 % IV SOLN: INTRAVENOUS | Status: DC

## 2024-10-08 MED ORDER — FENTANYL CITRATE (PF) 100 MCG/2ML IJ SOLN
INTRAMUSCULAR | Status: DC | PRN
  Administered 2024-10-08 (×4): 25 ug via INTRAVENOUS

## 2024-10-08 MED ORDER — HYDROMORPHONE HCL 1 MG/ML IJ SOLN
INTRAMUSCULAR | Status: AC
  Filled 2024-10-08: qty 1

## 2024-10-08 MED ORDER — PROPOFOL 10 MG/ML IV BOLUS
INTRAVENOUS | Status: AC
  Filled 2024-10-08: qty 20

## 2024-10-08 MED ORDER — DEXAMETHASONE SOD PHOSPHATE PF 10 MG/ML IJ SOLN
10.0000 mg | Freq: Once | INTRAMUSCULAR | Status: AC
  Administered 2024-10-09: 10 mg via INTRAVENOUS
  Filled 2024-10-08: qty 1

## 2024-10-08 MED ORDER — VANCOMYCIN HCL 1 G IV SOLR
INTRAVENOUS | Status: DC | PRN
  Administered 2024-10-08: 1000 mg via TOPICAL

## 2024-10-08 MED ORDER — CHLORHEXIDINE GLUCONATE 0.12 % MT SOLN
15.0000 mL | Freq: Once | OROMUCOSAL | Status: AC
  Administered 2024-10-08: 15 mL via OROMUCOSAL

## 2024-10-08 MED ORDER — HYDROMORPHONE HCL 1 MG/ML IJ SOLN
1.0000 mg | Freq: Three times a day (TID) | INTRAMUSCULAR | Status: DC | PRN
  Administered 2024-10-08: 1 mg via INTRAVENOUS
  Filled 2024-10-08: qty 1

## 2024-10-08 MED ORDER — SODIUM CHLORIDE 0.9 % IR SOLN
Status: DC | PRN
  Administered 2024-10-08: 1000 mL

## 2024-10-08 MED ORDER — ONDANSETRON HCL 4 MG/2ML IJ SOLN
INTRAMUSCULAR | Status: AC
  Filled 2024-10-08: qty 2

## 2024-10-08 MED ORDER — TRANEXAMIC ACID-NACL 1000-0.7 MG/100ML-% IV SOLN
1000.0000 mg | INTRAVENOUS | Status: AC
  Administered 2024-10-08: 1000 mg via INTRAVENOUS
  Filled 2024-10-08: qty 100

## 2024-10-08 MED ORDER — MENTHOL 3 MG MT LOZG: 1.0000 | LOZENGE | OROMUCOSAL | Status: DC | PRN

## 2024-10-08 MED ORDER — METHOCARBAMOL 500 MG PO TABS
500.0000 mg | ORAL_TABLET | Freq: Four times a day (QID) | ORAL | Status: DC | PRN
  Administered 2024-10-08 – 2024-10-09 (×3): 500 mg via ORAL
  Filled 2024-10-08 (×3): qty 1

## 2024-10-08 MED ORDER — ISOPROPYL ALCOHOL 70 % SOLN
Status: DC | PRN
  Administered 2024-10-08: 1 via TOPICAL

## 2024-10-08 MED ORDER — ACETAMINOPHEN 325 MG PO TABS: 325.0000 mg | ORAL_TABLET | Freq: Four times a day (QID) | ORAL | Status: DC | PRN

## 2024-10-08 MED ORDER — POVIDONE-IODINE 10 % EX SWAB: 2.0000 | Freq: Once | CUTANEOUS | Status: DC

## 2024-10-08 MED ORDER — MUPIROCIN 2 % EX OINT: 1.0000 | TOPICAL_OINTMENT | Freq: Two times a day (BID) | CUTANEOUS | 0 refills | Status: AC

## 2024-10-08 MED ORDER — FENTANYL CITRATE (PF) 100 MCG/2ML IJ SOLN
INTRAMUSCULAR | Status: AC
  Filled 2024-10-08: qty 2

## 2024-10-08 MED ORDER — OXYCODONE HCL 5 MG PO TABS: 5.0000 mg | ORAL_TABLET | Freq: Once | ORAL | Status: DC | PRN

## 2024-10-08 MED ORDER — MIDAZOLAM HCL (PF) 2 MG/2ML IJ SOLN: 0.5000 mg | Freq: Once | INTRAMUSCULAR | Status: DC | PRN

## 2024-10-08 MED ORDER — CEFAZOLIN SODIUM-DEXTROSE 2-4 GM/100ML-% IV SOLN
2.0000 g | Freq: Four times a day (QID) | INTRAVENOUS | Status: AC
  Administered 2024-10-08 (×2): 2 g via INTRAVENOUS
  Filled 2024-10-08 (×2): qty 100

## 2024-10-08 MED ORDER — 0.9 % SODIUM CHLORIDE (POUR BTL) OPTIME
TOPICAL | Status: DC | PRN
  Administered 2024-10-08: 1000 mL

## 2024-10-08 MED ORDER — ONDANSETRON HCL 4 MG/2ML IJ SOLN: 4.0000 mg | Freq: Four times a day (QID) | INTRAMUSCULAR | Status: DC | PRN

## 2024-10-08 MED ORDER — VANCOMYCIN HCL 1000 MG IV SOLR
INTRAVENOUS | Status: AC
  Filled 2024-10-08: qty 20

## 2024-10-08 MED ORDER — METOCLOPRAMIDE HCL 5 MG PO TABS: 5.0000 mg | ORAL_TABLET | Freq: Three times a day (TID) | ORAL | Status: DC | PRN

## 2024-10-08 MED ORDER — CEFAZOLIN SODIUM-DEXTROSE 2-4 GM/100ML-% IV SOLN
2.0000 g | INTRAVENOUS | Status: AC
  Administered 2024-10-08: 2 g via INTRAVENOUS
  Filled 2024-10-08: qty 100

## 2024-10-08 MED ORDER — PHENYLEPHRINE HCL-NACL 20-0.9 MG/250ML-% IV SOLN
INTRAVENOUS | Status: DC | PRN
  Administered 2024-10-08: 20 ug/min via INTRAVENOUS

## 2024-10-08 MED ORDER — BUPIVACAINE IN DEXTROSE 0.75-8.25 % IT SOLN
INTRATHECAL | Status: DC | PRN
  Administered 2024-10-08: 12 mg via INTRATHECAL

## 2024-10-08 MED ORDER — DOCUSATE SODIUM 100 MG PO CAPS
100.0000 mg | ORAL_CAPSULE | Freq: Two times a day (BID) | ORAL | Status: DC
  Administered 2024-10-08 – 2024-10-09 (×2): 100 mg via ORAL
  Filled 2024-10-08 (×2): qty 1

## 2024-10-08 MED ORDER — ACETAMINOPHEN 500 MG PO TABS
1000.0000 mg | ORAL_TABLET | Freq: Once | ORAL | Status: AC
  Administered 2024-10-08: 1000 mg via ORAL
  Filled 2024-10-08: qty 2

## 2024-10-08 MED ORDER — TRANEXAMIC ACID-NACL 1000-0.7 MG/100ML-% IV SOLN
1000.0000 mg | Freq: Once | INTRAVENOUS | Status: AC
  Administered 2024-10-08: 1000 mg via INTRAVENOUS
  Filled 2024-10-08: qty 100

## 2024-10-08 MED ORDER — KETOROLAC TROMETHAMINE 15 MG/ML IJ SOLN
7.5000 mg | Freq: Four times a day (QID) | INTRAMUSCULAR | Status: AC
  Administered 2024-10-08 – 2024-10-09 (×4): 7.5 mg via INTRAVENOUS
  Filled 2024-10-08 (×4): qty 1

## 2024-10-08 MED ORDER — ROPIVACAINE HCL 7.5 MG/ML IJ SOLN
INTRAMUSCULAR | Status: DC | PRN
  Administered 2024-10-08: 20 mL via PERINEURAL

## 2024-10-08 MED ORDER — TRANEXAMIC ACID 1000 MG/10ML IV SOLN
INTRAVENOUS | Status: DC | PRN
  Administered 2024-10-08: 2000 mg via TOPICAL

## 2024-10-08 MED ORDER — ORAL CARE MOUTH RINSE: 15.0000 mL | Freq: Once | OROMUCOSAL | Status: AC

## 2024-10-08 MED ORDER — ONDANSETRON HCL 4 MG/2ML IJ SOLN
INTRAMUSCULAR | Status: DC | PRN
  Administered 2024-10-08: 4 mg via INTRAVENOUS

## 2024-10-08 MED ORDER — MEPERIDINE HCL 25 MG/ML IJ SOLN: 6.2500 mg | INTRAMUSCULAR | Status: DC | PRN

## 2024-10-08 MED ORDER — BUPIVACAINE-MELOXICAM ER 200-6 MG/7ML IJ SOLN
INTRAMUSCULAR | Status: DC | PRN
  Administered 2024-10-08: 400 mg

## 2024-10-08 MED ORDER — OXYCODONE HCL 5 MG PO TABS: 10.0000 mg | ORAL_TABLET | Freq: Four times a day (QID) | ORAL | Status: DC | PRN

## 2024-10-08 MED ORDER — PHENOL 1.4 % MT LIQD: 1.0000 | OROMUCOSAL | Status: DC | PRN

## 2024-10-08 MED ORDER — METHOCARBAMOL 1000 MG/10ML IJ SOLN: 500.0000 mg | Freq: Four times a day (QID) | INTRAMUSCULAR | Status: DC | PRN

## 2024-10-08 MED ORDER — ONDANSETRON HCL 4 MG PO TABS: 4.0000 mg | ORAL_TABLET | Freq: Four times a day (QID) | ORAL | Status: DC | PRN

## 2024-10-08 MED ORDER — BUPIVACAINE-MELOXICAM ER 400-12 MG/14ML IJ SOLN
INTRAMUSCULAR | Status: AC
  Filled 2024-10-08: qty 1

## 2024-10-08 MED ORDER — PROPOFOL 1000 MG/100ML IV EMUL
INTRAVENOUS | Status: AC
  Filled 2024-10-08: qty 100

## 2024-10-08 MED ORDER — APIXABAN 2.5 MG PO TABS
2.5000 mg | ORAL_TABLET | Freq: Two times a day (BID) | ORAL | Status: DC
  Administered 2024-10-09: 2.5 mg via ORAL
  Filled 2024-10-08: qty 1

## 2024-10-08 MED ORDER — ACETAMINOPHEN 500 MG PO TABS
1000.0000 mg | ORAL_TABLET | Freq: Four times a day (QID) | ORAL | Status: AC
  Administered 2024-10-08 – 2024-10-09 (×4): 1000 mg via ORAL
  Filled 2024-10-08 (×4): qty 2

## 2024-10-08 MED ORDER — NIFEDIPINE ER OSMOTIC RELEASE 30 MG PO TB24
30.0000 mg | ORAL_TABLET | Freq: Every day | ORAL | Status: DC
  Administered 2024-10-08: 30 mg via ORAL
  Filled 2024-10-08: qty 1

## 2024-10-08 MED ORDER — METOCLOPRAMIDE HCL 5 MG/ML IJ SOLN: 5.0000 mg | Freq: Three times a day (TID) | INTRAMUSCULAR | Status: DC | PRN

## 2024-10-08 NOTE — Evaluation (Signed)
 Physical Therapy Evaluation Patient Details Name: Beth Paul MRN: [id number] DOB: 01/19/1951 Today's Date: 10/08/2024  History of Present Illness  Pt s/p R TKR and with hx of osteoporosis  Clinical Impression  Pt s/p R TKR and presents with decreased R LE strength/ROM and post op pain limiting functional mobility.  Pt should progress to dc home with family assist and report plan is for HHPT follow up starting 10/10/24.        If plan is discharge home, recommend the following: A little help with walking and/or transfers;A little help with bathing/dressing/bathroom;Assistance with cooking/housework;Assist for transportation;Help with stairs or ramp for entrance   Can travel by private vehicle        Equipment Recommendations None recommended by PT  Recommendations for Other Services       Functional Status Assessment Patient has had a recent decline in their functional status and demonstrates the ability to make significant improvements in function in a reasonable and predictable amount of time.     Precautions / Restrictions Precautions Precautions: Knee;Fall Restrictions Weight Bearing Restrictions Per Provider Order: Yes RLE Weight Bearing Per Provider Order: Weight bearing as tolerated      Mobility  Bed Mobility Overal bed mobility: Needs Assistance Bed Mobility: Supine to Sit     Supine to sit: Min assist     General bed mobility comments: cues for sequence and use of L LE to self assist    Transfers Overall transfer level: Needs assistance Equipment used: Rolling walker (2 wheels) Transfers: Sit to/from Stand Sit to Stand: Min assist           General transfer comment: cues for LE management and use of UEs to self assist    Ambulation/Gait Ambulation/Gait assistance: Min assist Gait Distance (Feet): 44 Feet Assistive device: Rolling walker (2 wheels) Gait Pattern/deviations: Step-to pattern Gait velocity: decr     General Gait Details: cues  for sequence, posture and position from [employer]            Wheelchair Mobility     Tilt Bed    Modified Rankin (Stroke Patients Only)       Balance Overall balance assessment: Needs assistance Sitting-balance support: No upper extremity supported, Feet supported Sitting balance-Leahy Scale: Good     Standing balance support: Bilateral upper extremity supported Standing balance-Leahy Scale: Poor                               Pertinent Vitals/Pain Pain Assessment Pain Assessment: 0-10 Pain Score: 5  Pain Location: R knee Pain Descriptors / Indicators: Aching, Sore Pain Intervention(s): Limited activity within patient's tolerance, Monitored during session, Premedicated before session, Ice applied    Home Living Family/patient expects to be discharged to:: Private residence Living Arrangements: Spouse/significant other Available Help at Discharge: Family;Available 24 hours/day Type of Home: House Home Access: Stairs to enter Entrance Stairs-Rails: Right;Left;Can reach both Entrance Stairs-Number of Steps: 3   Home Layout: Able to live on main level with bedroom/bathroom Home Equipment: [employer] (2 wheels);Cane - single point      Prior Function Prior Level of Function : Independent/Modified Independent                     Extremity/Trunk Assessment   Upper Extremity Assessment Upper Extremity Assessment: Overall WFL for tasks assessed    Lower Extremity Assessment Lower Extremity Assessment: RLE deficits/detail    Cervical / Trunk  Assessment Cervical / Trunk Assessment: Normal  Communication   Communication Communication: No apparent difficulties    Cognition Arousal: Alert Behavior During Therapy: WFL for tasks assessed/performed   PT - Cognitive impairments: No apparent impairments                         Following commands: Intact       Cueing Cueing Techniques: Verbal cues     General Comments       Exercises Total Joint Exercises Ankle Circles/Pumps: AROM, Both, 15 reps, Supine   Assessment/Plan    PT Assessment Patient needs continued PT services  PT Problem List Decreased strength;Decreased range of motion;Decreased activity tolerance;Decreased balance;Decreased mobility;Decreased knowledge of use of DME;Pain       PT Treatment Interventions DME instruction;Gait training;Stair training;Functional mobility training;Therapeutic activities;Therapeutic exercise;Balance training;Patient/family education    PT Goals (Current goals can be found in the Care Plan section)  Acute Rehab PT Goals Patient Stated Goal: Regain IND PT Goal Formulation: With patient Time For Goal Achievement: 10/08/24 Potential to Achieve Goals: Good    Frequency 7X/week     Co-evaluation               AM-PAC PT 6 Clicks Mobility  Outcome Measure Help needed turning from your back to your side while in a flat bed without using bedrails?: A Little Help needed moving from lying on your back to sitting on the side of a flat bed without using bedrails?: A Little Help needed moving to and from a bed to a chair (including a wheelchair)?: A Little Help needed standing up from a chair using your arms (e.g., wheelchair or bedside chair)?: A Little Help needed to walk in hospital room?: A Little Help needed climbing 3-5 steps with a railing? : A Lot 6 Click Score: 17    End of Session Equipment Utilized During Treatment: Gait belt Activity Tolerance: Patient tolerated treatment well Patient left: in chair;with call bell/phone within reach;with chair alarm set;with family/visitor present Nurse Communication: Mobility status PT Visit Diagnosis: Difficulty in walking, not elsewhere classified (R26.2)    Time: 8490-8465 PT Time Calculation (min) (ACUTE ONLY): 25 min   Charges:   PT Evaluation $PT Eval Low Complexity: 1 Low   PT General Charges $$ ACUTE PT VISIT: 1 Visit         [facility]  PT Acute Rehabilitation Services Office [phone]   Cynda Soule 10/08/2024, 3:41 PM

## 2024-10-08 NOTE — Care Management Obs Status (Signed)
 MEDICARE OBSERVATION STATUS NOTIFICATION   Patient Details  Name: Beth Paul MRN: [id number] Date of Birth: 1951-08-10   Medicare Observation Status Notification Given:  Yes    Sonda Manuella Quill, RN 10/08/2024, 4:49 PM

## 2024-10-08 NOTE — Op Note (Signed)
 "  Total Knee Arthroplasty Procedure Note  Preoperative diagnosis: Right knee osteoarthritis  Postoperative diagnosis:same  Operative findings: Complete loss articular cartilage from patella, medial compartment  Operative procedure: Right total knee arthroplasty. CPT [phone]  Surgeon: N. Ozell Cummins, MD  Assist: Ronal Morna Grave, PA-C; necessary for the timely completion of procedure and due to complexity of procedure.  Anesthesia: Spinal, regional, local  Tourniquet time: see anesthesia record  Implants used: Zimmer persona Femur: CR 9 narrow Tibia: E Patella: 32 mm Polyethylene: 10 mm medial congruent  Indication: Beth Paul is a 74 y.o. year old female with a history of knee pain. Having failed conservative management, the patient elected to proceed with a total knee arthroplasty.  We have reviewed the risk and benefits of the surgery and they elected to proceed after voicing understanding.  Procedure:  After informed consent was obtained and understanding of the risk were voiced including but not limited to bleeding, infection, damage to surrounding structures including nerves and vessels, blood clots, leg length inequality and the failure to achieve desired results, the operative extremity was marked with verbal confirmation of the patient in the holding area.   The patient was then brought to the operating room and transported to the operating room table in the supine position.  A tourniquet was applied to the operative extremity around the upper thigh. The operative limb was then prepped and draped in the usual sterile fashion and preoperative antibiotics were administered.  A time out was performed prior to the start of surgery confirming the correct extremity, preoperative antibiotic administration, as well as team members, implants and instruments available for the case. Correct surgical site was also confirmed with preoperative radiographs. The limb was then  elevated for exsanguination and the tourniquet was inflated to 275 mmHg. A midline incision was made and a standard medial parapatellar approach was performed.  The infrapatellar fat pad was removed.  Suprapatellar synovium was removed to reveal the anterior distal femoral cortex.  A medial peel was performed to release the capsule and the deep MCL off of the medial tibial plateau back to the semimembranosus.  The patella was then everted which showed complete loss of articular cartilage and was resected down to 15 mm from 25 mm and sized to a 32 mm.  A cover was placed on the patella for protection from retractors.  The knee was then brought into flexion and we then turned our attention to the femur.  The ACL was sacrificed.  Start site was drilled in the femur and the intramedullary distal femoral cutting guide was placed, set at 5 degrees valgus, taking 10 mm of distal resection. The distal cut was made. Osteophytes were then removed.  Next, the proximal tibial cutting guide was placed with appropriate slope, varus/valgus alignment and depth of resection.  The drop rod was attached to confirm that it was aimed at the second metatarsal.  The proximal tibial cut was made taking 2 mm off the low side. Gap blocks were then used to assess the extension gap and alignment, and appropriate soft tissue releases were performed. Attention was turned back to the femur, which was sized using the sizing guide to a size 9 narrow. Appropriate rotation of the femoral component was determined using epicondylar axis, Whitesides line, and assessing the flexion gap under ligament tension. The appropriate size 4-in-1 cutting block was placed and checked with an angel wing and cuts were made. Posterior femoral osteophytes and uncapped bone were then removed with the  curved osteotome.  The menisci were removed.  Trial components were placed, and stability was checked in full extension, mid-flexion, and deep flexion.  The knee was  slightly tight in both flexion and extension therefore I resected an additional 2 mm from the tibia.  I trialed again and was happy with how the knee felt in full extension, mid flexion and full flexion.  Gravity flexion to 125 degrees.  PCL was retained. Proper tibial rotation was determined and marked.  The patella tracked well without a lateral release.  The femoral lugs were then drilled. Trial components were then removed and tibial preparation performed.  The trial tibia was pointed to the medial third of the tibial tubercle.  The tibia was sized for a size E component and prepared.  Trial components were removed. Sclerotic bone was drilled.  The bony surfaces were irrigated with a pulse lavage and then dried. Bone cement was vacuum mixed on the back table, and the final components sized above were cemented into place.  Antibiotic irrigation was placed in the knee joint and soft tissues while the cement cured.  After cement had finished curing, excess cement was removed. The stability of the construct was re-evaluated throughout a range of motion and found to be acceptable. The trial liner was removed, the knee was copiously irrigated, and the knee was re-evaluated for any excess bone debris. The real polyethylene liner, 10 mm thick, was inserted and checked to ensure the locking mechanism had engaged appropriately. The tourniquet was deflated and hemostasis was achieved. The wound was irrigated with normal saline.  One gram of vancomycin  powder was placed in the surgical bed.  Topical mixture of 0.25% bupivacaine and meloxicam was placed in the joint for postoperative pain.  Capsular closure was performed with a #1 stratafix in flexion, subcutaneous fat closed with a 0 vicryl suture, then subcutaneous tissue closed with interrupted 2.0 vicryl suture. The skin was then closed with a 3.0 monocryl and steri strips. A sterile dressing was applied.  The patient was awakened in the operating room and taken to  recovery in stable condition. All sponge, needle, and instrument counts were correct at the end of the case.  Morna Grave was necessary for opening, closing, retracting, limb positioning and overall facilitation and completion of the surgery.  Position: supine  Complications: none.  Time Out: performed   Drains/Packing: none Estimated blood loss: minimal Returned to Recovery Room: in good condition.   Mechanical VTE (DVT) Prophylaxis: sequential compression devices, TED thigh-high  Chemical VTE (DVT) Prophylaxis: eliquis  POD 1  Fluid Replacement  Crystalloid: see anesthesia record Blood: none  FFP: none   Specimens Removed: 1 to pathology  Sponge and Instrument Count Correct? yes  PACU: portable radiograph - knee AP and Lateral  Plan/RTC: Return in 2 weeks for suture removal.  Weight Bearing/Load Lower Extremity: full   Implant Name Type Inv. Item Serial No. Manufacturer Lot No. LRB No. Used Action  CEMENT BONE REFOBACIN [id number] US  - [id number] Cement CEMENT BONE REFOBACIN [id number] US   ZIMMER RECON(ORTH,TRAU,BIO,SG) [id number] Right 2 Implanted  COMPONENT FEM KNEE NRW SZ9 RT - [id number] Joint COMPONENT FEM KNEE NRW SZ9 RT  ZIMMER RECON(ORTH,TRAU,BIO,SG) [id number] Right 1 Implanted  INSERT TIBIA KNEE RIGHT 10 - [id number] Joint INSERT TIBIA KNEE RIGHT 10  ZIMMER RECON(ORTH,TRAU,BIO,SG) [id number] Right 1 Implanted  COMPONET TIB PS KNEE E 0D RT - [id number] Joint COMPONET TIB PS KNEE E 0D RT  ZIMMER RECON(ORTH,TRAU,BIO,SG) [id number] Right 1 Implanted  STEM POLY PAT PLY  57M KNEE - [id number] Knees STEM POLY PAT PLY 57M KNEE  ZIMMER RECON(ORTH,TRAU,BIO,SG) [id number] Right 1 Implanted    N. Ozell Cummins, MD [facility] 8:55 AM  "

## 2024-10-08 NOTE — Plan of Care (Signed)
" °  Problem: Education: Goal: Knowledge of General Education information will improve Description: Including pain rating scale, medication(s)/side effects and non-pharmacologic comfort measures Outcome: Progressing   Problem: Health Behavior/Discharge Planning: Goal: Ability to manage health-related needs will improve Outcome: Progressing   Problem: Clinical Measurements: Goal: Ability to maintain clinical measurements within normal limits will improve Outcome: Progressing Goal: Will remain free from infection Outcome: Progressing Goal: Diagnostic test results will improve Outcome: Progressing Goal: Respiratory complications will improve Outcome: Progressing Goal: Cardiovascular complication will be avoided Outcome: Progressing   Problem: Activity: Goal: Risk for activity intolerance will decrease Outcome: Progressing   Problem: Nutrition: Goal: Adequate nutrition will be maintained Outcome: Progressing   Problem: Coping: Goal: Level of anxiety will decrease Outcome: Progressing   Problem: Elimination: Goal: Will not experience complications related to bowel motility Outcome: Progressing Goal: Will not experience complications related to urinary retention Outcome: Progressing   Problem: Pain Managment: Goal: General experience of comfort will improve and/or be controlled Outcome: Progressing   Problem: Safety: Goal: Ability to remain free from injury will improve Outcome: Progressing   Problem: Skin Integrity: Goal: Risk for impaired skin integrity will decrease Outcome: Progressing   Problem: Education: Goal: Knowledge of the prescribed therapeutic regimen will improve Outcome: Progressing   Problem: Bowel/Gastric: Goal: Gastrointestinal status for postoperative course will improve Outcome: Progressing   Problem: Cardiac: Goal: Ability to maintain an adequate cardiac output Outcome: Progressing Goal: Will show no evidence of cardiac arrhythmias Outcome:  Progressing   Problem: Nutritional: Goal: Will attain and maintain optimal nutritional status Outcome: Progressing   Problem: Neurological: Goal: Will regain or maintain usual level of consciousness Outcome: Progressing   Problem: Clinical Measurements: Goal: Ability to maintain clinical measurements within normal limits Outcome: Progressing Goal: Postoperative complications will be avoided or minimized Outcome: Progressing   Problem: Respiratory: Goal: Will regain and/or maintain adequate ventilation Outcome: Progressing Goal: Respiratory status will improve Outcome: Progressing   Problem: Skin Integrity: Goal: Demonstrates signs of wound healing without infection Outcome: Progressing   Problem: Urinary Elimination: Goal: Will remain free from infection Outcome: Progressing Goal: Ability to achieve and maintain adequate urine output Outcome: Progressing   Problem: Education: Goal: Knowledge of the prescribed therapeutic regimen will improve Outcome: Progressing Goal: Individualized Educational Video(s) Outcome: Progressing   Problem: Activity: Goal: Ability to avoid complications of mobility impairment will improve Outcome: Progressing Goal: Range of joint motion will improve Outcome: Progressing   Problem: Clinical Measurements: Goal: Postoperative complications will be avoided or minimized Outcome: Progressing   Problem: Pain Management: Goal: Pain level will decrease with appropriate interventions Outcome: Progressing   Problem: Skin Integrity: Goal: Will show signs of wound healing Outcome: Progressing   "

## 2024-10-08 NOTE — Anesthesia Procedure Notes (Signed)
 Spinal  Patient location during procedure: OR End time: 10/08/2024 7:34 AM Reason for block: surgical anesthesia  Staffing Performed: anesthesiologist  Authorized by: Leonce Athens, MD   Performed by: Leonce Athens, MD  Preanesthetic Checklist Completed: patient identified, IV checked, site marked, risks and benefits discussed, surgical consent, monitors and equipment checked, pre-op evaluation and timeout performed Spinal Block Patient position: sitting Prep: DuraPrep Patient monitoring: heart rate, cardiac monitor, continuous pulse ox and blood pressure Approach: midline Location: L3-4 Injection technique: single-shot Needle Needle type: Pencan and Introducer  Needle gauge: 24 G Needle length: 9 cm Assessment Sensory level: T4 Events: CSF return  Additional Notes Pt identified in Operating room.  Monitors applied. Working IV access confirmed. Sterile prep, drape lumbar spine.  1% lido local L 3,4.  #24ga Pencan into clear CSF L 3,4.  12mg  0.75% Bupivacaine with dextrose injected with asp CSF beginning and end of injection.  Patient asymptomatic, VSS, no heme aspirated, tolerated well.  JAYSON Leonce, MD

## 2024-10-08 NOTE — Care Management Obs Status (Deleted)
 MEDICARE OBSERVATION STATUS NOTIFICATION   Patient Details  Name: Beth Paul MRN: [id number] Date of Birth: Apr 07, 1951   Medicare Observation Status Notification Given:  Yes    Sonda Manuella Quill, RN 10/08/2024, 4:50 PM

## 2024-10-08 NOTE — Plan of Care (Signed)

## 2024-10-08 NOTE — Anesthesia Postprocedure Evaluation (Signed)
"   Anesthesia Post Note  Patient: Beth Paul  Procedure(s) Performed: ARTHROPLASTY, KNEE, TOTAL (Right: Knee)     Patient location during evaluation: PACU Anesthesia Type: Spinal Level of consciousness: oriented, patient cooperative and awake and alert Pain management: pain level controlled Vital Signs Assessment: post-procedure vital signs reviewed and stable Respiratory status: spontaneous breathing, nonlabored ventilation and respiratory function stable Cardiovascular status: blood pressure returned to baseline and stable Postop Assessment: spinal receding, patient able to bend at knees, no apparent nausea or vomiting and adequate PO intake Anesthetic complications: no   No notable events documented.  Last Vitals:  Vitals:   10/08/24 1030 10/08/24 1043  BP: (!) 155/79 (!) 144/93  Pulse: 67 69  Resp: 12 15  Temp: 36.5 C 36.6 C  SpO2: 93% 92%    Last Pain:  Vitals:   10/08/24 1043  TempSrc: Oral  PainSc: 0-No pain                 Marquetta Weiskopf,E. Wisdom Seybold      "

## 2024-10-08 NOTE — TOC Initial Note (Signed)
 Transition of Care [facility]) - Initial/Assessment Note    Patient Details  Name: Beth Paul MRN: [id number] Date of Birth: 11-30-1950  Transition of [facility]) CM/SW Contact:    Sonda Manuella Quill, RN Phone Number: 10/08/2024, 5:24 PM  Clinical Narrative:                 Acknowledge consult for d/c planning; orders received for HHPT/OT, BSC, 3-n-1, and RWl spoke w/ pt in room; pt said she lives at home w/ her spouse Beth Paul [phone]); she plans to return w/ his support at d/c; he will provide transportation; insurance/PCP verified; she denied SDOH risks; pt has RW, shower chair, and cane; she does not have home oxygen; pt declined receiving BSC; her HHPT/OT was arranged by Dr Benjiman office; IP CM is following.  Expected Discharge Plan: Home w Home Health Services Barriers to Discharge: Continued Medical Work up   Patient Goals and CMS Choice Patient states their goals for this hospitalization and ongoing recovery are:: home     [place] ownership interest in [facility].provided to:: Patient    Expected Discharge Plan and Services   Discharge Planning Services: CM Consult   Living arrangements for the past 2 months: Single Family Home                 DME Arranged: N/A DME Agency: NA       HH Arranged: PT, OT HH Agency: [facility]     Representative spoke with at [facility] Agency: HHPT arranged by Dr Benjiman office  Prior Living Arrangements/Services Living arrangements for the past 2 months: Single Family Home Lives with:: Spouse Patient language and need for interpreter reviewed:: Yes Do you feel safe going back to the place where you live?: Yes      Need for Family Participation in Patient Care: Yes (Comment) Care giver support system in place?: Yes (comment) Current home services: DME (cane, walker, shower chair) Criminal Activity/Legal Involvement Pertinent to Current Situation/Hospitalization: No - Comment as  needed  Activities of Daily Living   ADL Screening (condition at time of admission) Independently performs ADLs?: Yes (appropriate for developmental age) Is the patient deaf or have difficulty hearing?: No Does the patient have difficulty seeing, even when wearing glasses/contacts?: No Does the patient have difficulty concentrating, remembering, or making decisions?: No  Permission Sought/Granted Permission sought to share information with : Case Manager Permission granted to share information with : Yes, Verbal Permission Granted  Share Information with NAME: Case Manager     Permission granted to share info w Relationship: Ellerie Arenz (spouse) [phone]     Emotional Assessment Appearance:: Appears stated age Attitude/Demeanor/Rapport: Gracious Affect (typically observed): Accepting Orientation: : Oriented to Self, Oriented to Place, Oriented to  Time, Oriented to Situation Alcohol / Substance Use: Not Applicable Psych Involvement: No (comment)  Admission diagnosis:  Primary osteoarthritis of right knee [M17.11] Status post total right knee replacement [Z96.651] Patient Active Problem List   Diagnosis Date Noted   Status post total right knee replacement 10/08/2024   Primary osteoarthritis of right knee 10/07/2024   Obstructive sleep apnea (adult) (pediatric) 08/27/2013   Essential hypertension, benign 08/27/2013   Hypercholesteremia 08/27/2013   PCP:  Loreli Kins, MD Pharmacy:   [facility] [address], [place] - [address] [place] [address] Phone: [phone] Fax: [phone]     Social Drivers of Health (SDOH) Social History: SDOH Screenings   Food Insecurity: No Food  Insecurity (10/08/2024)  Housing: Low Risk (10/08/2024)  Transportation Needs: No Transportation Needs (10/08/2024)  Utilities: Not At Risk (10/08/2024)  Social Connections: Moderately Integrated (10/08/2024)  Tobacco Use: Medium Risk (10/08/2024)    SDOH Interventions: Food Insecurity Interventions: Intervention Not Indicated, Inpatient TOC Housing Interventions: Intervention Not Indicated, Inpatient TOC Transportation Interventions: Intervention Not Indicated, Inpatient TOC Utilities Interventions: Intervention Not Indicated, Inpatient TOC   Readmission Risk Interventions     No data to display

## 2024-10-08 NOTE — Transfer of Care (Signed)
 Immediate Anesthesia Transfer of Care Note  Patient: Beth Paul  Procedure(s) Performed: ARTHROPLASTY, KNEE, TOTAL (Right: Knee)  Patient Location: PACU  Anesthesia Type:MAC  Level of Consciousness: awake, alert , oriented, and patient cooperative  Airway & Oxygen Therapy: Patient Spontanous Breathing and Patient connected to face mask oxygen  Post-op Assessment: Report given to RN and Post -op Vital signs reviewed and stable  Post vital signs: Reviewed and stable  Last Vitals:  Vitals Value Taken Time  BP 149/89 10/08/24 09:30  Temp    Pulse 69 10/08/24 09:30  Resp 12 10/08/24 09:30  SpO2 96 % 10/08/24 09:30  Vitals shown include unfiled device data.  Last Pain:  Vitals:   10/08/24 0553  TempSrc:   PainSc: 0-No pain         Complications: No notable events documented.

## 2024-10-08 NOTE — H&P (Signed)
 "   PREOPERATIVE H&P  Chief Complaint: right knee osteoarthritis  HPI: Beth Paul is a 74 y.o. female who presents for surgical treatment of right knee osteoarthritis.  She denies any changes in medical history.  Past Surgical History:  Procedure Laterality Date   ABDOMINAL HYSTERECTOMY     CATARACT EXTRACTION, BILATERAL     COLONOSCOPY  10/17/2004   INCONTINENCE SURGERY     TONSILLECTOMY AND ADENOIDECTOMY     TUBAL LIGATION     Social History   Socioeconomic History   Marital status: Married    Spouse name: Not on file   Number of children: Not on file   Years of education: Not on file   Highest education level: Not on file  Occupational History   Not on file  Tobacco Use   Smoking status: Former    Current packs/day: 0.00    Types: Cigarettes    Quit date: 09/16/1989    Years since quitting: 35.0   Smokeless tobacco: Not on file  Vaping Use   Vaping status: Never Used  Substance and Sexual Activity   Alcohol use: Yes    Comment: social.   Drug use: No   Sexual activity: Not on file  Other Topics Concern   Not on file  Social History Narrative   Not on file   Social Drivers of Health   Tobacco Use: Medium Risk (10/08/2024)   Patient History    Smoking Tobacco Use: Former    Smokeless Tobacco Use: Unknown    Passive Exposure: Not on [place] Strain: Not on file  Food Insecurity: Not on file  Transportation Needs: Not on file  Physical Activity: Not on file  Stress: Not on file  Social Connections: Not on file  Depression (PHQ2-9): Not on file  Alcohol Screen: Not on file  Housing: Not on file  Utilities: Not on file  Health Literacy: Not on file   Family History  Problem Relation Age of Onset   Hypertension Mother    Dementia Mother    Heart disease Father    Hyperlipidemia Father    Allergies  Allergen Reactions   Lipitor [Atorvastatin] Other (See Comments)    Muscle aches   Zocor  [Simvastatin] Other (See Comments)    Muscle aches    Prior to Admission medications  Medication Sig Start Date End Date Taking? Authorizing Provider  aspirin EC 81 MG tablet Take 81 mg by mouth at bedtime. Swallow whole.   Yes [provider]  Calcium Carb-Cholecalciferol (CALCIUM + D3) 600-200 MG-UNIT TABS Take 1 tablet by mouth daily at 12 noon.   Yes [provider]  conjugated estrogens (PREMARIN) vaginal cream Place 1 applicator vaginally every other day. At night (apply pea-sized amount)   Yes [provider]  ezetimibe (ZETIA) 10 MG tablet Take 10 mg by mouth at bedtime.   Yes [provider]  Multiple Vitamin (MULTIVITAMIN WITH MINERALS) TABS tablet Take 1 tablet by mouth in the morning.   Yes [provider]  NIFEdipine (PROCARDIA-XL/NIFEDICAL-XL) 30 MG 24 hr tablet Take 30 mg by mouth at bedtime.   Yes [provider]  Omega-3 Fatty Acids (FISH OIL PO) Take 1 capsule by mouth in the morning.   Yes [provider]  pravastatin (PRAVACHOL) 40 MG tablet Take 40 mg by mouth at bedtime.   Yes [provider]  sertraline (ZOLOFT) 100 MG tablet Take 200 mg by mouth at bedtime.   Yes [provider]  apixaban  (ELIQUIS ) 2.5 MG TABS tablet Take one tab po bid x 30 days after surgery to prevent blood clots 09/28/24   Jule Ronal CROME, PA-C  docusate sodium  (COLACE) 100 MG capsule Take 1 capsule (100 mg total) by mouth daily as needed. 09/23/24 09/23/25  Jule Ronal CROME, PA-C  methocarbamol  (ROBAXIN ) 500 MG tablet Take 1 tablet (500 mg total) by mouth 2 (two) times daily as needed. 09/23/24   Jule Ronal CROME, PA-C  ondansetron  (ZOFRAN ) 4 MG tablet Take 1 tablet (4 mg total) by mouth every 8 (eight) hours as needed for nausea or vomiting. 09/23/24   Jule Ronal CROME, PA-C  oxyCODONE -acetaminophen  (PERCOCET) 5-325 MG tablet Take 1 tablet by mouth every 6 (six) hours as needed. To be taken after surgery 09/23/24   Jule Ronal CROME, PA-C     Positive ROS: All other systems have been reviewed and were otherwise negative with the exception of those mentioned in the HPI and as above.  Physical Exam: General: Alert, no acute distress Cardiovascular: No pedal edema Respiratory: No cyanosis, no use of accessory musculature GI: abdomen soft Skin: No lesions in the area of chief complaint Neurologic: Sensation intact distally Psychiatric: Patient is competent for consent with normal mood and affect Lymphatic: no lymphedema  MUSCULOSKELETAL: exam stable  Assessment: right knee osteoarthritis  Plan: Plan for Procedures: ARTHROPLASTY, KNEE, TOTAL  The risks benefits and alternatives were discussed with the patient including but not limited to the risks of nonoperative treatment, versus surgical intervention including infection, bleeding, nerve injury,  blood clots, cardiopulmonary complications, morbidity, mortality, among others, and they were willing to proceed.   Ozell Cummins, MD 10/08/2024 6:13 AM  "

## 2024-10-08 NOTE — Anesthesia Procedure Notes (Signed)
 Anesthesia Regional Block: Adductor canal block   Pre-Anesthetic Checklist: , timeout performed,  Correct Patient, Correct Site, Correct Laterality,  Correct Procedure, Correct Position, site marked,  Risks and benefits discussed,  Surgical consent,  Pre-op evaluation,  At surgeon's request and post-op pain management  Laterality: Right and Lower  Prep: chloraprep       Needles:  Injection technique: Single-shot  Needle Type: Echogenic Needle     Needle Length: 9cm  Needle Gauge: 21     Additional Needles:   Procedures:,,,, ultrasound used (permanent image in chart),,    Narrative:  Start time: 10/08/2024 6:54 AM End time: 10/08/2024 7:00 AM Injection made incrementally with aspirations every 5 mL.  Performed by: Personally  Anesthesiologist: Beth Athens, MD  Additional Notes: Pt identified in Holding room.  Monitors applied. Working IV access confirmed. Timeout, Sterile prep R thigh.  #21ga ECHOgenic Arrow block needle into adductor canal with US  guidance.  20cc 0.75% Ropivacaine injected incrementally after negative test dose.  Patient asymptomatic, VSS, no heme aspirated, tolerated well.   Beth Paul Leonce, MD

## 2024-10-08 NOTE — Plan of Care (Signed)
 " Problem: Education: Goal: Knowledge of General Education information will improve Description: Including pain rating scale, medication(s)/side effects and non-pharmacologic comfort measures 10/08/2024 1959 by Lenon Charmaine LABOR, RN Outcome: Progressing 10/08/2024 1959 by Lenon Charmaine LABOR, RN Outcome: Progressing   Problem: Health Behavior/Discharge Planning: Goal: Ability to manage health-related needs will improve 10/08/2024 1959 by Lenon Charmaine LABOR, RN Outcome: Progressing 10/08/2024 1959 by Lenon Charmaine LABOR, RN Outcome: Progressing   Problem: Clinical Measurements: Goal: Ability to maintain clinical measurements within normal limits will improve 10/08/2024 1959 by Lenon Charmaine LABOR, RN Outcome: Progressing 10/08/2024 1959 by Lenon Charmaine LABOR, RN Outcome: Progressing Goal: Will remain free from infection 10/08/2024 1959 by Lenon Charmaine LABOR, RN Outcome: Progressing 10/08/2024 1959 by Lenon Charmaine LABOR, RN Outcome: Progressing Goal: Diagnostic test results will improve 10/08/2024 1959 by Lenon Charmaine LABOR, RN Outcome: Progressing 10/08/2024 1959 by Lenon Charmaine LABOR, RN Outcome: Progressing Goal: Respiratory complications will improve 10/08/2024 1959 by Lenon Charmaine LABOR, RN Outcome: Progressing 10/08/2024 1959 by Lenon Charmaine LABOR, RN Outcome: Progressing Goal: Cardiovascular complication will be avoided 10/08/2024 1959 by Lenon Charmaine LABOR, RN Outcome: Progressing 10/08/2024 1959 by Lenon Charmaine LABOR, RN Outcome: Progressing   Problem: Activity: Goal: Risk for activity intolerance will decrease 10/08/2024 1959 by Lenon Charmaine LABOR, RN Outcome: Progressing 10/08/2024 1959 by Lenon Charmaine LABOR, RN Outcome: Progressing   Problem: Nutrition: Goal: Adequate nutrition will be maintained 10/08/2024 1959 by Lenon Charmaine LABOR, RN Outcome: Progressing 10/08/2024 1959 by Lenon Charmaine LABOR, RN Outcome: Progressing   Problem:  Coping: Goal: Level of anxiety will decrease 10/08/2024 1959 by Lenon Charmaine LABOR, RN Outcome: Progressing 10/08/2024 1959 by Lenon Charmaine LABOR, RN Outcome: Progressing   Problem: Elimination: Goal: Will not experience complications related to bowel motility 10/08/2024 1959 by Lenon Charmaine LABOR, RN Outcome: Progressing 10/08/2024 1959 by Lenon Charmaine LABOR, RN Outcome: Progressing Goal: Will not experience complications related to urinary retention 10/08/2024 1959 by Lenon Charmaine LABOR, RN Outcome: Progressing 10/08/2024 1959 by Lenon Charmaine LABOR, RN Outcome: Progressing   Problem: Pain Managment: Goal: General experience of comfort will improve and/or be controlled 10/08/2024 1959 by Lenon Charmaine LABOR, RN Outcome: Progressing 10/08/2024 1959 by Lenon Charmaine LABOR, RN Outcome: Progressing   Problem: Safety: Goal: Ability to remain free from injury will improve 10/08/2024 1959 by Lenon Charmaine LABOR, RN Outcome: Progressing 10/08/2024 1959 by Lenon Charmaine LABOR, RN Outcome: Progressing   Problem: Skin Integrity: Goal: Risk for impaired skin integrity will decrease 10/08/2024 1959 by Lenon Charmaine LABOR, RN Outcome: Progressing 10/08/2024 1959 by Lenon Charmaine LABOR, RN Outcome: Progressing   Problem: Education: Goal: Knowledge of the prescribed therapeutic regimen will improve 10/08/2024 1959 by Lenon Charmaine LABOR, RN Outcome: Progressing 10/08/2024 1959 by Lenon Charmaine LABOR, RN Outcome: Progressing   Problem: Bowel/Gastric: Goal: Gastrointestinal status for postoperative course will improve 10/08/2024 1959 by Lenon Charmaine LABOR, RN Outcome: Progressing 10/08/2024 1959 by Lenon Charmaine LABOR, RN Outcome: Progressing   Problem: Cardiac: Goal: Ability to maintain an adequate cardiac output 10/08/2024 1959 by Lenon Charmaine LABOR, RN Outcome: Progressing 10/08/2024 1959 by Lenon Charmaine LABOR, RN Outcome: Progressing Goal: Will show no evidence  of cardiac arrhythmias 10/08/2024 1959 by Lenon Charmaine LABOR, RN Outcome: Progressing 10/08/2024 1959 by Lenon Charmaine LABOR, RN Outcome: Progressing   Problem: Nutritional: Goal: Will attain and maintain optimal nutritional status 10/08/2024 1959 by Lenon Charmaine LABOR, RN Outcome: Progressing 10/08/2024 1959 by Lenon Charmaine LABOR, RN Outcome: Progressing   Problem: Neurological: Goal: Will regain or maintain usual level of  consciousness 10/08/2024 1959 by Lenon Charmaine LABOR, RN Outcome: Progressing 10/08/2024 1959 by Lenon Charmaine LABOR, RN Outcome: Progressing   Problem: Clinical Measurements: Goal: Ability to maintain clinical measurements within normal limits 10/08/2024 1959 by Lenon Charmaine LABOR, RN Outcome: Progressing 10/08/2024 1959 by Lenon Charmaine LABOR, RN Outcome: Progressing Goal: Postoperative complications will be avoided or minimized 10/08/2024 1959 by Lenon Charmaine LABOR, RN Outcome: Progressing 10/08/2024 1959 by Lenon Charmaine LABOR, RN Outcome: Progressing   Problem: Respiratory: Goal: Will regain and/or maintain adequate ventilation 10/08/2024 1959 by Lenon Charmaine LABOR, RN Outcome: Progressing 10/08/2024 1959 by Lenon Charmaine LABOR, RN Outcome: Progressing Goal: Respiratory status will improve 10/08/2024 1959 by Lenon Charmaine LABOR, RN Outcome: Progressing 10/08/2024 1959 by Lenon Charmaine LABOR, RN Outcome: Progressing   Problem: Skin Integrity: Goal: Demonstrates signs of wound healing without infection 10/08/2024 1959 by Lenon Charmaine LABOR, RN Outcome: Progressing 10/08/2024 1959 by Lenon Charmaine LABOR, RN Outcome: Progressing   Problem: Urinary Elimination: Goal: Will remain free from infection 10/08/2024 1959 by Lenon Charmaine LABOR, RN Outcome: Progressing 10/08/2024 1959 by Lenon Charmaine LABOR, RN Outcome: Progressing Goal: Ability to achieve and maintain adequate urine output 10/08/2024 1959 by Lenon Charmaine LABOR,  RN Outcome: Progressing 10/08/2024 1959 by Lenon Charmaine LABOR, RN Outcome: Progressing   Problem: Education: Goal: Knowledge of the prescribed therapeutic regimen will improve 10/08/2024 1959 by Lenon Charmaine LABOR, RN Outcome: Progressing 10/08/2024 1959 by Lenon Charmaine LABOR, RN Outcome: Progressing Goal: Individualized Educational Video(s) 10/08/2024 1959 by Lenon Charmaine LABOR, RN Outcome: Progressing 10/08/2024 1959 by Lenon Charmaine LABOR, RN Outcome: Progressing   Problem: Activity: Goal: Ability to avoid complications of mobility impairment will improve 10/08/2024 1959 by Lenon Charmaine LABOR, RN Outcome: Progressing 10/08/2024 1959 by Lenon Charmaine LABOR, RN Outcome: Progressing Goal: Range of joint motion will improve 10/08/2024 1959 by Lenon Charmaine LABOR, RN Outcome: Progressing 10/08/2024 1959 by Lenon Charmaine LABOR, RN Outcome: Progressing   Problem: Clinical Measurements: Goal: Postoperative complications will be avoided or minimized 10/08/2024 1959 by Lenon Charmaine LABOR, RN Outcome: Progressing 10/08/2024 1959 by Lenon Charmaine LABOR, RN Outcome: Progressing   Problem: Pain Management: Goal: Pain level will decrease with appropriate interventions 10/08/2024 1959 by Lenon Charmaine LABOR, RN Outcome: Progressing 10/08/2024 1959 by Lenon Charmaine LABOR, RN Outcome: Progressing   Problem: Skin Integrity: Goal: Will show signs of wound healing 10/08/2024 1959 by Lenon Charmaine LABOR, RN Outcome: Progressing 10/08/2024 1959 by Lenon Charmaine LABOR, RN Outcome: Progressing   "

## 2024-10-08 NOTE — Progress Notes (Signed)
 Orthopedic Tech Progress Note Patient Details:  Beth Paul 1950/09/26 [id number]  Ortho Devices Type of Ortho Device: Bone foam zero knee Ortho Device/Splint Location: RLE Ortho Device/Splint Interventions: Application   Post Interventions Patient Tolerated: Well  [name] 10/08/2024, 9:32 AM

## 2024-10-08 NOTE — Discharge Instructions (Addendum)

## 2024-10-09 DIAGNOSIS — M1711 Unilateral primary osteoarthritis, right knee: Secondary | ICD-10-CM | POA: Diagnosis not present

## 2024-10-09 NOTE — Progress Notes (Signed)
 Physical Therapy Treatment Patient Details Name: Beth Paul MRN: [id number] DOB: 05/21/1951 Today's Date: 10/09/2024   History of Present Illness Pt s/p R TKR and with hx of osteoporosis    PT Comments  Pt up to ambulate in hall, negotiated stairs and provided with HEP.  Pt and spouse eager for dc home this date.    If plan is discharge home, recommend the following: A little help with walking and/or transfers;A little help with bathing/dressing/bathroom;Assistance with cooking/housework;Assist for transportation;Help with stairs or ramp for entrance   Can travel by private vehicle        Equipment Recommendations  None recommended by PT    Recommendations for Other Services       Precautions / Restrictions Precautions Precautions: Knee;Fall Restrictions Weight Bearing Restrictions Per Provider Order: Yes RLE Weight Bearing Per Provider Order: Weight bearing as tolerated     Mobility  Bed Mobility Overal bed mobility: Needs Assistance Bed Mobility: Supine to Sit     Supine to sit: Contact guard, Supervision     General bed mobility comments: Up in chair and returns to same    Transfers Overall transfer level: Needs assistance Equipment used: Rolling walker (2 wheels) Transfers: Sit to/from Stand Sit to Stand: Supervision           General transfer comment: cues for LE management and use of UEs to self assist    Ambulation/Gait Ambulation/Gait assistance: Contact guard assist, Supervision Gait Distance (Feet): 75 Feet Assistive device: Rolling walker (2 wheels) Gait Pattern/deviations: Step-to pattern, Shuffle, Trunk flexed Gait velocity: decr     General Gait Details: cues for sequence, posture and position from RW   Stairs Stairs: Yes Stairs assistance: Contact guard assist Stair Management: Two rails, Step to pattern, Forwards Number of Stairs: 3 General stair comments: cues for sequence   Wheelchair Mobility     Tilt Bed     Modified Rankin (Stroke Patients Only)       Balance Overall balance assessment: Needs assistance Sitting-balance support: No upper extremity supported, Feet supported Sitting balance-Leahy Scale: Good     Standing balance support: Bilateral upper extremity supported Standing balance-Leahy Scale: Poor                              Communication Communication Communication: No apparent difficulties  Cognition Arousal: Alert Behavior During Therapy: WFL for tasks assessed/performed   PT - Cognitive impairments: No apparent impairments                         Following commands: Intact      Cueing Cueing Techniques: Verbal cues  Exercises Total Joint Exercises Ankle Circles/Pumps: AROM, Both, 15 reps, Supine Quad Sets: AROM, Both, 10 reps, Supine Heel Slides: AAROM, Right, 15 reps, Supine Hip ABduction/ADduction: AAROM, Right, 10 reps, Supine Straight Leg Raises: AAROM, Right, 10 reps, Supine Long Arc Quad: AAROM, Right, 10 reps, Seated    General Comments        Pertinent Vitals/Pain Pain Assessment Pain Assessment: 0-10 Pain Score: 5  Pain Location: R knee Pain Descriptors / Indicators: Aching, Sore Pain Intervention(s): Limited activity within patient's tolerance, Monitored during session, Premedicated before session, Ice applied    Home Living                          Prior Function  PT Goals (current goals can now be found in the care plan section) Acute Rehab PT Goals Patient Stated Goal: Regain IND PT Goal Formulation: With patient Time For Goal Achievement: 10/08/24 Potential to Achieve Goals: Good Progress towards PT goals: Progressing toward goals    Frequency    7X/week      PT Plan      Co-evaluation              AM-PAC PT 6 Clicks Mobility   Outcome Measure  Help needed turning from your back to your side while in a flat bed without using bedrails?: A Little Help needed moving  from lying on your back to sitting on the side of a flat bed without using bedrails?: A Little Help needed moving to and from a bed to a chair (including a wheelchair)?: A Little Help needed standing up from a chair using your arms (e.g., wheelchair or bedside chair)?: A Little Help needed to walk in hospital room?: A Little Help needed climbing 3-5 steps with a railing? : A Little 6 Click Score: 18    End of Session Equipment Utilized During Treatment: Gait belt Activity Tolerance: Patient tolerated treatment well Patient left: in chair;with call bell/phone within reach;with chair alarm set;with family/visitor present Nurse Communication: Mobility status PT Visit Diagnosis: Difficulty in walking, not elsewhere classified (R26.2)     Time: 9045-8985 PT Time Calculation (min) (ACUTE ONLY): 20 min  Charges:    $Gait Training: 8-22 mins $Therapeutic Exercise: 8-22 mins PT General Charges $$ ACUTE PT VISIT: 1 Visit                     [facility] PT Acute Rehabilitation Services Office [phone]    Beth Paul 10/09/2024, 1:16 PM

## 2024-10-09 NOTE — Progress Notes (Signed)
 Provided discharge educations/instructions, all questions answered. Pt is alert and oriented, on room air, not in any distress. Pt discharged home with all of her belongings accompanied by her husband.

## 2024-10-09 NOTE — Progress Notes (Signed)
 Physical Therapy Treatment Patient Details Name: Beth Paul MRN: [id number] DOB: 1951-05-05 Today's Date: 10/09/2024   History of Present Illness Pt s/p R TKR and with hx of osteoporosis    PT Comments  Pt cooperative and progressing well with mobility.  Pt up to ambulate increased distance in hall and HEP initiated.  Will follow up with stair training in preparation for dc later this am.    If plan is discharge home, recommend the following: A little help with walking and/or transfers;A little help with bathing/dressing/bathroom;Assistance with cooking/housework;Assist for transportation;Help with stairs or ramp for entrance   Can travel by private vehicle        Equipment Recommendations  None recommended by PT    Recommendations for Other Services       Precautions / Restrictions Precautions Precautions: Knee;Fall Restrictions Weight Bearing Restrictions Per Provider Order: Yes RLE Weight Bearing Per Provider Order: Weight bearing as tolerated     Mobility  Bed Mobility Overal bed mobility: Needs Assistance Bed Mobility: Supine to Sit     Supine to sit: Contact guard, Supervision     General bed mobility comments: Increased time, pt self assisting R LE with UEs    Transfers Overall transfer level: Needs assistance Equipment used: Rolling walker (2 wheels) Transfers: Sit to/from Stand Sit to Stand: Contact guard assist           General transfer comment: cues for LE management and use of UEs to self assist    Ambulation/Gait Ambulation/Gait assistance: Contact guard assist Gait Distance (Feet): 100 Feet Assistive device: Rolling walker (2 wheels) Gait Pattern/deviations: Step-to pattern, Shuffle, Trunk flexed Gait velocity: decr     General Gait Details: cues for sequence, posture and position from [employer]             Wheelchair Mobility     Tilt Bed    Modified Rankin (Stroke Patients Only)       Balance Overall balance  assessment: Needs assistance Sitting-balance support: No upper extremity supported, Feet supported Sitting balance-Leahy Scale: Good     Standing balance support: Bilateral upper extremity supported Standing balance-Leahy Scale: Poor                              Communication Communication Communication: No apparent difficulties  Cognition Arousal: Alert Behavior During Therapy: WFL for tasks assessed/performed   PT - Cognitive impairments: No apparent impairments                         Following commands: Intact      Cueing Cueing Techniques: Verbal cues  Exercises Total Joint Exercises Ankle Circles/Pumps: AROM, Both, 15 reps, Supine Quad Sets: AROM, Both, 10 reps, Supine Heel Slides: AAROM, Right, 15 reps, Supine Hip ABduction/ADduction: AAROM, Right, 10 reps, Supine Straight Leg Raises: AAROM, Right, 10 reps, Supine Long Arc Quad: AAROM, Right, 10 reps, Seated    General Comments        Pertinent Vitals/Pain Pain Assessment Pain Assessment: 0-10 Pain Score: 5  Pain Location: R knee Pain Descriptors / Indicators: Aching, Sore Pain Intervention(s): Limited activity within patient's tolerance, Monitored during session, Premedicated before session, Ice applied    Home Living                          Prior Function  PT Goals (current goals can now be found in the care plan section) Acute Rehab PT Goals Patient Stated Goal: Regain IND PT Goal Formulation: With patient Time For Goal Achievement: 10/08/24 Potential to Achieve Goals: Good Progress towards PT goals: Progressing toward goals    Frequency    7X/week      PT Plan      Co-evaluation              AM-PAC PT 6 Clicks Mobility   Outcome Measure  Help needed turning from your back to your side while in a flat bed without using bedrails?: A Little Help needed moving from lying on your back to sitting on the side of a flat bed without using  bedrails?: A Little Help needed moving to and from a bed to a chair (including a wheelchair)?: A Little Help needed standing up from a chair using your arms (e.g., wheelchair or bedside chair)?: A Little Help needed to walk in hospital room?: A Little Help needed climbing 3-5 steps with a railing? : A Lot 6 Click Score: 17    End of Session Equipment Utilized During Treatment: Gait belt Activity Tolerance: Patient tolerated treatment well Patient left: in chair;with call bell/phone within reach;with chair alarm set;with family/visitor present Nurse Communication: Mobility status PT Visit Diagnosis: Difficulty in walking, not elsewhere classified (R26.2)     Time: 9167-9093 PT Time Calculation (min) (ACUTE ONLY): 34 min  Charges:    $Gait Training: 8-22 mins $Therapeutic Exercise: 8-22 mins PT General Charges $$ ACUTE PT VISIT: 1 Visit                     [facility] PT Acute Rehabilitation Services Office [phone]    Arvind Mexicano 10/09/2024, 1:11 PM

## 2024-10-09 NOTE — Discharge Summary (Signed)
 "    Patient ID: Beth Paul MRN: [id number] DOB/AGE: 1951/07/12 74 y.o.  Admit date: 10/08/2024 Discharge date: 10/09/2024  Admission Diagnoses:  Primary osteoarthritis of right knee  Discharge Diagnoses:  Principal Problem:   Primary osteoarthritis of right knee Active Problems:   Status post total right knee replacement   Past Medical History:  Diagnosis Date   Arthritis    Cancer (HCC)    chest/basal cell   Complication of anesthesia    Fecal incontinence    Dr Roni   Female bladder prolapse    Dr. Teressa   Hypercholesteremia    Hypertension    OSA (obstructive sleep apnea)    w CPAP at 6cm H2O   Osteoporosis    PONV (postoperative nausea and vomiting)     Surgeries: Procedures: ARTHROPLASTY, KNEE, TOTAL on 10/08/2024   Consultants (if any):   Discharged Condition: Improved  Hospital Course: Beth Paul is an 74 y.o. female who was admitted 10/08/2024 with a diagnosis of Primary osteoarthritis of right knee and went to the operating room on 10/08/2024 and underwent the above named procedures.    She was given perioperative antibiotics:  Anti-infectives (From admission, onward)    Start     Dose/Rate Route Frequency Ordered Stop   10/08/24 1400  ceFAZolin  (ANCEF ) IVPB 2g/100 mL premix        2 g 200 mL/hr over 30 Minutes Intravenous Every 6 hours 10/08/24 1041 10/08/24 2011   10/08/24 0806  vancomycin  (VANCOCIN ) powder  Status:  Discontinued          As needed 10/08/24 0806 10/08/24 0937   10/08/24 0600  ceFAZolin  (ANCEF ) IVPB 2g/100 mL premix        2 g 200 mL/hr over 30 Minutes Intravenous On call to O.R. 10/08/24 9464 10/08/24 0740     .  She was given sequential compression devices, early ambulation, and appropriate chemoprophylaxis for DVT prophylaxis.  She benefited maximally from the hospital stay and there were no complications.    Recent vital signs:  Vitals:   10/09/24 0127 10/09/24 0606  BP: (!) 145/83 (!) 139/92  Pulse: 70 72   Resp: 16 18  Temp: 97.7 F (36.5 C) (!) 97.5 F (36.4 C)  SpO2: 97% 93%    Recent laboratory studies:  Lab Results  Component Value Date   HGB 15.8 (H) 09/29/2024   Lab Results  Component Value Date   WBC 8.4 09/29/2024   PLT 254 09/29/2024   No results found for: INR Lab Results  Component Value Date   NA 139 09/29/2024   K 4.4 09/29/2024   CL 105 09/29/2024   CO2 25 09/29/2024   BUN 12 09/29/2024   CREATININE 0.66 09/29/2024   GLUCOSE 103 (H) 09/29/2024    Discharge Medications:   Allergies as of 10/09/2024       Reactions   Lipitor [atorvastatin] Other (See Comments)   Muscle aches   Zocor [simvastatin] Other (See Comments)   Muscle aches        Medication List     STOP taking these medications    aspirin EC 81 MG tablet   FISH OIL PO       TAKE these medications    apixaban  2.5 MG Tabs tablet Commonly known as: Eliquis  Take one tab po bid x 30 days after surgery to prevent blood clots   Calcium + D3 600-200 MG-UNIT Tabs Take 1 tablet by mouth daily at 12 noon.   chlorhexidine   4 % external liquid Commonly known as: HIBICLENS  Apply 15 mLs (1 Application total) topically as directed for 30 doses. Use as directed daily for 5 days every other week for 6 weeks.   conjugated estrogens vaginal cream Commonly known as: PREMARIN Place 1 applicator vaginally every other day. At night (apply pea-sized amount)   docusate sodium  100 MG capsule Commonly known as: Colace Take 1 capsule (100 mg total) by mouth daily as needed.   ezetimibe 10 MG tablet Commonly known as: ZETIA Take 10 mg by mouth at bedtime.   methocarbamol  500 MG tablet Commonly known as: ROBAXIN  Take 1 tablet (500 mg total) by mouth 2 (two) times daily as needed.   multivitamin with minerals Tabs tablet Take 1 tablet by mouth in the morning.   mupirocin  ointment 2 % Commonly known as: BACTROBAN  Place 1 Application into the nose 2 (two) times daily for 60 doses. Use as  directed 2 times daily for 5 days every other week for 6 weeks.   NIFEdipine  30 MG 24 hr tablet Commonly known as: PROCARDIA -XL/NIFEDICAL-XL Take 30 mg by mouth at bedtime.   ondansetron  4 MG tablet Commonly known as: Zofran  Take 1 tablet (4 mg total) by mouth every 8 (eight) hours as needed for nausea or vomiting.   oxyCODONE -acetaminophen  5-325 MG tablet Commonly known as: Percocet Take 1 tablet by mouth every 6 (six) hours as needed. To be taken after surgery   pravastatin 40 MG tablet Commonly known as: PRAVACHOL Take 40 mg by mouth at bedtime.   sertraline 100 MG tablet Commonly known as: ZOLOFT Take 200 mg by mouth at bedtime.               Durable Medical Equipment  (From admission, onward)           Start     Ordered   10/08/24 1042  DME Walker rolling  Once       Question Answer Comment  Walker: With 5 Inch Wheels   Patient needs a walker to treat with the following condition Status post left partial knee replacement      10/08/24 1041   10/08/24 1042  DME 3 n 1  Once        10/08/24 1041   10/08/24 1042  DME Bedside commode  Once       Question:  Patient needs a bedside commode to treat with the following condition  Answer:  Status post left partial knee replacement   10/08/24 1041            Diagnostic Studies: DG Knee Right Port Result Date: 10/08/2024 CLINICAL DATA:  Pain, postop. EXAM: PORTABLE RIGHT KNEE - 1-2 VIEW COMPARISON:  None Available. FINDINGS: Right knee arthroplasty in expected alignment. No periprosthetic lucency or fracture. There has been patellar resurfacing. Recent postsurgical change includes air and edema in the soft tissues and joint space. IMPRESSION: Right knee arthroplasty without immediate postoperative complication. Electronically Signed   By: Andrea Gasman M.D.   On: 10/08/2024 11:16    Disposition: Discharge disposition: 01-Home or Self Care       Discharge Instructions     Call MD / Call 911   Complete  by: As directed    If you experience chest pain or shortness of breath, CALL 911 and be transported to the hospital emergency room.  If you develope a fever above 101.5 F, pus (white drainage) or increased drainage or redness at the wound, or calf pain, call your surgeon's office.  Constipation Prevention   Complete by: As directed    Drink plenty of fluids.  Prune juice may be helpful.  You may use a stool softener, such as Colace (over the counter) 100 mg twice a day.  Use MiraLax (over the counter) for constipation as needed.   Driving restrictions   Complete by: As directed    No driving while taking narcotic pain meds.   Increase activity slowly as tolerated   Complete by: As directed    Post-operative opioid taper instructions:   Complete by: As directed    POST-OPERATIVE OPIOID TAPER INSTRUCTIONS: It is important to wean off of your opioid medication as soon as possible. If you do not need pain medication after your surgery it is ok to stop day one. Opioids include: Codeine, Hydrocodone(Norco, Vicodin), Oxycodone (Percocet, oxycontin ) and hydromorphone  amongst others.  Long term and even short term use of opiods can cause: Increased pain response Dependence Constipation Depression Respiratory depression And more.  Withdrawal symptoms can include Flu like symptoms Nausea, vomiting And more Techniques to manage these symptoms Hydrate well Eat regular healthy meals Stay active Use relaxation techniques(deep breathing, meditating, yoga) Do Not substitute Alcohol  to help with tapering If you have been on opioids for less than two weeks and do not have pain than it is ok to stop all together.  Plan to wean off of opioids This plan should start within one week post op of your joint replacement. Maintain the same interval or time between taking each dose and first decrease the dose.  Cut the total daily intake of opioids by one tablet each day Next start to increase the time  between doses. The last dose that should be eliminated is the evening dose.           Follow-up Information     Chasey, Dull, PA-C. Go on 10/21/2024.   Specialty: Orthopedic Surgery Why: at 8:30 for your first post operative appointment in [facility] office Contact information: [address] [place] [address] [phone]                  Signed: Ozell Cummins 10/09/2024, 10:01 AM  "

## 2024-10-09 NOTE — TOC Transition Note (Signed)
 Transition of Care [facility]) - Discharge Note   Patient Details  Name: Beth Paul MRN: [id number] Date of Birth: 1950-10-13  Transition of Care [facility] CM/SW Contact:  Sonda Manuella Quill, RN Phone Number: 10/09/2024, 10:16 AM   Clinical Narrative:    D/C orders received; pt has DME; HHPT/OT w/ [facility] arranged by Dr Benjiman office; agency contact info placd in d/c instructions; Artavia at agency notified; no IP CM needs.   Final next level of care: Home w Home Health Services Barriers to Discharge: No Barriers Identified   Patient Goals and CMS Choice Patient states their goals for this hospitalization and ongoing recovery are:: home     [place] ownership interest in [facility].provided to:: Patient    Discharge Placement                       Discharge Plan and Services Additional resources added to the After Visit Summary for     Discharge Planning Services: CM Consult            DME Arranged: N/A DME Agency: NA       HH Arranged: PT, OT HH Agency: [facility] ([facility])     Representative spoke with at [facility] Agency: HHPT arranged by Dr Benjiman office  Social Drivers of Health (SDOH) Interventions SDOH Screenings   Food Insecurity: No Food Insecurity (10/08/2024)  Housing: Low Risk (10/08/2024)  Transportation Needs: No Transportation Needs (10/08/2024)  Utilities: Not At Risk (10/08/2024)  Social Connections: Moderately Integrated (10/08/2024)  Tobacco Use: Medium Risk (10/08/2024)     Readmission Risk Interventions     No data to display

## 2024-10-09 NOTE — Plan of Care (Signed)

## 2024-10-09 NOTE — Progress Notes (Signed)
" ° °  Subjective:  Patient reports pain as mild.  Had a good night. Husband at bedside.  Today's  total administered Morphine Milligram Equivalents: 15  Objective:   VITALS:   Vitals:   10/08/24 2143 10/08/24 2238 10/09/24 0127 10/09/24 0606  BP: (!) 148/90  (!) 145/83 (!) 139/92  Pulse: 73 72 70 72  Resp: 18 18 16 18   Temp: 98.6 F (37 C)  97.7 F (36.5 C) (!) 97.5 F (36.4 C)  TempSrc: Oral  Oral Oral  SpO2: 96% 97% 97% 93%  Weight:      Height:        Intact pulses distally Dorsiflexion/Plantar flexion intact Incision: dressing C/D/I and no drainage No cellulitis present Compartment soft   Lab Results  Component Value Date   WBC 8.4 09/29/2024   HGB 15.8 (H) 09/29/2024   HCT 48.1 (H) 09/29/2024   MCV 94.1 09/29/2024   PLT 254 09/29/2024     Assessment/Plan:  1 Day Post-Op   - will clear PT today and then home - DVT ppx - SCDs, ambulation, eliquis  - WBAT operative extremity  Beth Paul 10/09/2024, 10:01 AM  "

## 2024-10-09 NOTE — Progress Notes (Signed)
 OT Cancellation Note and Discharge  Patient Details Name: CHAMILLE WERNTZ MRN: [id number] DOB: [birth year]   Cancelled Treatment:    Reason Eval/Treat Not Completed: OT screened, no needs identified, will sign off. Spoke with PT seeing patient and no OT needs per patient. Donny BECKER OT Acute Rehabilitation Services Office [phone]    [place] Dorothyann Distel 10/09/2024, 9:50 AM

## 2024-10-11 ENCOUNTER — Encounter (HOSPITAL_COMMUNITY): Payer: Self-pay | Admitting: Orthopaedic Surgery

## 2024-10-19 ENCOUNTER — Telehealth: Payer: Self-pay | Admitting: *Deleted

## 2024-10-19 ENCOUNTER — Other Ambulatory Visit: Payer: Self-pay | Admitting: Physician Assistant

## 2024-10-19 MED ORDER — HYDROCODONE-ACETAMINOPHEN 5-325 MG PO TABS: 1.0000 | ORAL_TABLET | Freq: Three times a day (TID) | ORAL | 0 refills | Status: AC | PRN

## 2024-10-19 NOTE — Telephone Encounter (Signed)
 Sounds good.  Sent in norco

## 2024-10-19 NOTE — Telephone Encounter (Signed)
 Call to patient to check on her and she is doing very well> HHPT never got to her b/c of weather. She starts OPPT tomorrow. Will need refill, but we talked about going down to Norco as she really isn't using very much of it. Sees you Thursday in office.Thank you.

## 2024-10-21 ENCOUNTER — Ambulatory Visit: Admitting: Physician Assistant

## 2024-10-21 DIAGNOSIS — Z96651 Presence of right artificial knee joint: Secondary | ICD-10-CM

## 2024-10-21 NOTE — Progress Notes (Signed)
 "  Post-Op Visit Note   Patient: Beth Paul           Date of Birth: [birth year]           MRN: [id number] Visit Date: 10/21/2024 PCP: Loreli Kins, MD   Assessment & Plan:  Chief Complaint:  Chief Complaint  Patient presents with   Right Knee - Routine Post Op   Visit Diagnoses:  1. Status post total right knee replacement     Plan: Patient is a pleasant 74 year old female who comes in today 2 weeks status post right total knee replacement.  She has been doing relatively well.  She has only been taking Tylenol  for pain for the past few days.  She has been compliant taking Eliquis  twice daily for DVT prophylaxis.  She has not been able to get home health PT due to the weather and inability for anyone to get up her driveway.  She has been working on a home exercise program and is ambulating with a walker.  She is scheduled to start outpatient physical therapy tomorrow.  Examination of the right knee reveals a well-healed surgical scar without complication.  Calf soft nontender.  She is neurovascularly intact distally.  Today, new Steri-Strips were applied.  She will continue with her Eliquis  twice daily for another 2 weeks and then transition to a baby aspirin twice daily for 2 weeks.  She will follow-up with us  in 4 weeks for repeat evaluation and 2 view x-rays of the right knee.  Call with concerns or questions.  Follow-Up Instructions: Return in about 4 weeks (around 11/18/2024).   Orders:  No orders of the defined types were placed in this encounter.  No orders of the defined types were placed in this encounter.   Imaging: No new imaging  PMFS History: Patient Active Problem List   Diagnosis Date Noted   Status post total right knee replacement 10/08/2024   Primary osteoarthritis of right knee 10/07/2024   Obstructive sleep apnea (adult) (pediatric) 08/27/2013   Essential hypertension, benign 08/27/2013   Hypercholesteremia 08/27/2013   Past Medical History:   Diagnosis Date   Arthritis    Cancer (HCC)    chest/basal cell   Complication of anesthesia    Fecal incontinence    Dr Roni   Female bladder prolapse    Dr. Teressa   Hypercholesteremia    Hypertension    OSA (obstructive sleep apnea)    w CPAP at 6cm H2O   Osteoporosis    PONV (postoperative nausea and vomiting)     Family History  Problem Relation Age of Onset   Hypertension Mother    Dementia Mother    Heart disease Father    Hyperlipidemia Father     Past Surgical History:  Procedure Laterality Date   ABDOMINAL HYSTERECTOMY     CATARACT EXTRACTION, BILATERAL     COLONOSCOPY  10/17/2004   INCONTINENCE SURGERY     TONSILLECTOMY AND ADENOIDECTOMY     TOTAL KNEE ARTHROPLASTY Right 10/08/2024   Procedure: ARTHROPLASTY, KNEE, TOTAL;  Surgeon: Jerri Kay HERO, MD;  Location: [facility];  Service: Orthopedics;  Laterality: Right;   TUBAL LIGATION     Social History   Occupational History   Not on file  Tobacco Use   Smoking status: Former    Current packs/day: 0.00    Types: Cigarettes    Quit date: 09/16/1989    Years since quitting: 35.1   Smokeless tobacco: Not on file  Vaping Use  Vaping status: Never Used  Substance and Sexual Activity   Alcohol  use: Yes    Comment: social.   Drug use: No   Sexual activity: Not on file     "

## 2024-12-13 ENCOUNTER — Ambulatory Visit
